# Patient Record
Sex: Female | Born: 1975
Health system: Southern US, Community
[De-identification: ages and names within clinical notes are randomized; demographics above are authoritative.]

## PROBLEM LIST (undated history)

## (undated) DIAGNOSIS — T8859XA Other complications of anesthesia, initial encounter: Secondary | ICD-10-CM

## (undated) DIAGNOSIS — R030 Elevated blood-pressure reading, without diagnosis of hypertension: Secondary | ICD-10-CM

## (undated) DIAGNOSIS — T4145XA Adverse effect of unspecified anesthetic, initial encounter: Secondary | ICD-10-CM

## (undated) DIAGNOSIS — Z8489 Family history of other specified conditions: Secondary | ICD-10-CM

## (undated) HISTORY — PX: HERNIA REPAIR: SHX51

## (undated) HISTORY — PX: WISDOM TOOTH EXTRACTION: SHX21

---

## 2003-09-24 ENCOUNTER — Ambulatory Visit (HOSPITAL_COMMUNITY): Admission: RE | Admit: 2003-09-24 | Discharge: 2003-09-24 | Payer: Self-pay | Admitting: *Deleted

## 2012-07-06 ENCOUNTER — Ambulatory Visit (INDEPENDENT_AMBULATORY_CARE_PROVIDER_SITE_OTHER): Payer: 59 | Admitting: Family Medicine

## 2012-07-06 VITALS — BP 108/75 | HR 61 | Temp 98.4°F | Resp 17 | Ht 64.5 in | Wt 148.0 lb

## 2012-07-06 DIAGNOSIS — F329 Major depressive disorder, single episode, unspecified: Secondary | ICD-10-CM

## 2012-07-06 MED ORDER — CITALOPRAM HYDROBROMIDE 20 MG PO TABS: 20.0000 mg | ORAL_TABLET | Freq: Every day | ORAL | Status: DC

## 2012-07-06 NOTE — Patient Instructions (Addendum)
Please start back on your celexa at 20 mg.  If we need to go up to 40 mg we can do so.  Please let me know how you are doing in the next 3 weeks or so- call or send me an email.  If you start to feel worse or are showing any signs of mania (not sleeping, racing thoughts, talking more than usual or other unusual behaviors) please let me know right away

## 2012-07-06 NOTE — Progress Notes (Signed)
[facility] [address], [place] [address] [phone]  Date:  07/06/2012   Name:  Erin Phelps   DOB:  09/29/1975   MRN:  [id number]  PCP:  No primary provider on file.    Chief Complaint: Depression and Medication Refill   History of Present Illness:  Erin Phelps is a 37 y.o. very pleasant female patient who presents with the following:  She is here today to talk about depression.  She was on celexa for a couple of years.  She had been out of her medicaiton and could not afford the medication for awhile.   She felt that she did well with celexa.  It helped with fatigue and helped her to avoid taking so much caffeine.   She was also on lamictal in the past- she was not diagnosed with bipolar disorder, it was used for depression. She never had any symptoms of mania. She was also going through a very hard time when she was on the lamictal, and her life is more stable and better at this time.    She has some anhedonia.  She feels very tired at the end of the day, and does not get a lot of joy out of her personal life.  "that's not the person who I am, I have friends and want to enjoy them."  She has had some trouble sleeping for the last couple of weeks- she sometimes wakes up too early.   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 She has noted recurrent symptoms of depression for 6 months or so.   She got her period yesterday.  Erin Phelps reports she had a complete blood panel done last year  There is no problem list on file for this patient.   Past Medical History  Diagnosis Date  . Depression     History reviewed. No pertinent past surgical history.  History  Substance Use Topics  . Smoking status: Current Every Day Smoker -- 0.5 packs/day for 10 years    Types: Cigarettes  . Smokeless tobacco: Not on file  . Alcohol Use: No    History reviewed. No pertinent family history.  No Known  Allergies  Medication list has been reviewed and updated.  No current outpatient prescriptions on file prior to visit.    Review of Systems:  As per HPI- otherwise negative. No SI, married, employed full- time  Physical Examination: Filed Vitals:   07/06/12 1152  BP: 108/75  Pulse: 61  Temp: 98.4 F (36.9 C)  Resp: 17   Filed Vitals:   07/06/12 1152  Height: 5' 4.5" (1.638 m)  Weight: 148 lb (67.132 kg)   Body mass index is 25.01 kg/(m^2). Ideal Body Weight: Weight in (lb) to have BMI = 25: 147.6   GEN: WDWN, NAD, Non-toxic, A & O x 3 HEENT: Atraumatic, Normocephalic. Neck supple. No masses, No LAD. Ears and Nose: No external deformity. CV: RRR, No M/G/R. No JVD. No thrill. No extra heart sounds. PULM: CTA B, no wheezes, crackles, rhonchi. No retractions. No resp. distress. No accessory muscle use. EXTR: No c/c/e NEURO Normal gait.  PSYCH: Normally interactive. Conversant. Not depressed or anxious appearing.  Calm demeanor.    Assessment and Plan: 1. Depression  citalopram (CELEXA) 20 MG tablet   Will restart celexa for depression.  Went over symptoms of mania- if she notes any of these symptoms she is  to let me know. However, she has not had mania in the past and does not have BPD.  She will call or email with an update in a few weeks and we can then discuss our next visit.    Abbe Amsterdam, MD

## 2012-09-03 ENCOUNTER — Encounter (HOSPITAL_COMMUNITY): Payer: Self-pay | Admitting: *Deleted

## 2012-09-03 ENCOUNTER — Emergency Department (HOSPITAL_COMMUNITY)
Admission: EM | Admit: 2012-09-03 | Discharge: 2012-09-04 | Disposition: A | Discharge status: Home / Self Care | Payer: 59 | Attending: Emergency Medicine | Admitting: Emergency Medicine

## 2012-09-03 DIAGNOSIS — Z3202 Encounter for pregnancy test, result negative: Secondary | ICD-10-CM | POA: Insufficient documentation

## 2012-09-03 DIAGNOSIS — F3289 Other specified depressive episodes: Secondary | ICD-10-CM | POA: Insufficient documentation

## 2012-09-03 DIAGNOSIS — Z79899 Other long term (current) drug therapy: Secondary | ICD-10-CM | POA: Insufficient documentation

## 2012-09-03 DIAGNOSIS — N10 Acute tubulo-interstitial nephritis: Secondary | ICD-10-CM | POA: Insufficient documentation

## 2012-09-03 DIAGNOSIS — F17200 Nicotine dependence, unspecified, uncomplicated: Secondary | ICD-10-CM | POA: Insufficient documentation

## 2012-09-03 DIAGNOSIS — M549 Dorsalgia, unspecified: Secondary | ICD-10-CM | POA: Insufficient documentation

## 2012-09-03 DIAGNOSIS — F329 Major depressive disorder, single episode, unspecified: Secondary | ICD-10-CM | POA: Insufficient documentation

## 2012-09-03 LAB — URINALYSIS, MICROSCOPIC ONLY
Bilirubin Urine: NEGATIVE
Glucose, UA: NEGATIVE mg/dL
Hgb urine dipstick: NEGATIVE
Ketones, ur: NEGATIVE mg/dL
Protein, ur: NEGATIVE mg/dL
pH: 5.5 (ref 5.0–8.0)

## 2012-09-03 LAB — CBC WITH DIFFERENTIAL/PLATELET
Eosinophils Absolute: 0.1 10*3/uL (ref 0.0–0.7)
Eosinophils Relative: 1 % (ref 0–5)
HCT: 29.8 % — ABNORMAL LOW (ref 36.0–46.0)
Lymphs Abs: 2.1 10*3/uL (ref 0.7–4.0)
MCH: 31.4 pg (ref 26.0–34.0)
MCHC: 37.9 g/dL — ABNORMAL HIGH (ref 30.0–36.0)
Monocytes Absolute: 0.3 10*3/uL (ref 0.1–1.0)
Platelets: 121 10*3/uL — ABNORMAL LOW (ref 150–400)

## 2012-09-03 LAB — COMPREHENSIVE METABOLIC PANEL
Alkaline Phosphatase: 58 U/L (ref 39–117)
Chloride: 104 mEq/L (ref 96–112)
Creatinine, Ser: 0.8 mg/dL (ref 0.50–1.10)
GFR calc non Af Amer: >90 mL/min (ref >90)
Glucose, Bld: 97 mg/dL (ref 70–99)
Potassium: 3.5 mEq/L (ref 3.5–5.1)
Sodium: 138 mEq/L (ref 135–145)
Total Bilirubin: 0.8 mg/dL (ref 0.3–1.2)
Total Protein: 7 g/dL (ref 6.0–8.3)

## 2012-09-03 NOTE — ED Notes (Signed)
Pt c/o abdominal pain and back pain x 2 days.  Went to [facility] today and were going to schedule ultrasound to look for gallstones, but pain has increased.  C/o nausea, no vomiting/diarrhea.

## 2012-09-04 ENCOUNTER — Emergency Department (HOSPITAL_COMMUNITY): Payer: 59

## 2012-09-04 MED ORDER — CEPHALEXIN 500 MG PO CAPS: 500.0000 mg | ORAL_CAPSULE | Freq: Four times a day (QID) | ORAL | Status: DC

## 2012-09-04 MED ORDER — KETOROLAC TROMETHAMINE 30 MG/ML IJ SOLN
INTRAMUSCULAR | Status: AC
  Filled 2012-09-04: qty 1

## 2012-09-04 MED ORDER — ONDANSETRON HCL 8 MG PO TABS: 8.0000 mg | ORAL_TABLET | Freq: Three times a day (TID) | ORAL | Status: DC | PRN

## 2012-09-04 MED ORDER — KETOROLAC TROMETHAMINE 30 MG/ML IJ SOLN
30.0000 mg | Freq: Once | INTRAMUSCULAR | Status: AC
  Administered 2012-09-04: 30 mg via INTRAVENOUS

## 2012-09-04 MED ORDER — ONDANSETRON HCL 4 MG/2ML IJ SOLN
INTRAMUSCULAR | Status: AC
  Filled 2012-09-04: qty 2

## 2012-09-04 MED ORDER — MORPHINE SULFATE 4 MG/ML IJ SOLN
INTRAMUSCULAR | Status: AC
  Filled 2012-09-04: qty 1

## 2012-09-04 MED ORDER — DEXTROSE 5 % IV SOLN
1.0000 g | INTRAVENOUS | Status: DC
  Administered 2012-09-04: 1 g via INTRAVENOUS
  Filled 2012-09-04: qty 10

## 2012-09-04 MED ORDER — HYDROCODONE-ACETAMINOPHEN 5-500 MG PO TABS: 1.0000 | ORAL_TABLET | Freq: Four times a day (QID) | ORAL | Status: DC | PRN

## 2012-09-04 MED ORDER — MORPHINE SULFATE 4 MG/ML IJ SOLN
4.0000 mg | Freq: Once | INTRAMUSCULAR | Status: AC
  Administered 2012-09-04: 4 mg via INTRAVENOUS
  Filled 2012-09-04: qty 1

## 2012-09-04 MED ORDER — ONDANSETRON HCL 4 MG/2ML IJ SOLN
4.0000 mg | Freq: Once | INTRAMUSCULAR | Status: AC
  Administered 2012-09-04: 4 mg via INTRAVENOUS

## 2012-09-04 MED ORDER — MORPHINE SULFATE 4 MG/ML IJ SOLN
4.0000 mg | Freq: Once | INTRAMUSCULAR | Status: AC
  Administered 2012-09-04: 4 mg via INTRAVENOUS

## 2012-09-04 NOTE — ED Provider Notes (Signed)
History     CSN: [id number]  Arrival date & time 09/03/12  2157   First MD Initiated Contact with Patient 09/03/12 2336      Chief Complaint  Patient presents with  . Abdominal Pain  . Back Pain    (Consider location/radiation/quality/duration/timing/severity/associated sxs/prior treatment) Patient is a 37 y.o. female presenting with abdominal pain and back pain. The history is provided by the patient.  Abdominal Pain Associated symptoms: no chest pain, no chills, no cough and no fever   Back Pain Associated symptoms: abdominal pain   Associated symptoms: no chest pain, no fever and no headaches   pt c/o right upper abd and right mid back pain, constant for past day, mod - severe. Non radiating. Worse w palpation. No dysuria, urgency or frequency. Nausea. No vomiting. No fever or chills. Pt states went to urgent care and wanted to send for u/s r/o gallstones. States has strong fam hx gallstones. No hx pyelo. No vaginal discharge or bleeding. Having regular periods.     Past Medical History  Diagnosis Date  . Depression     History reviewed. No pertinent past surgical history.  History reviewed. No pertinent family history.  History  Substance Use Topics  . Smoking status: Current Every Day Smoker -- 0.50 packs/day for 10 years    Types: Cigarettes  . Smokeless tobacco: Not on file  . Alcohol Use: No    OB History   Grav Para Term Preterm Abortions TAB SAB Ect Mult Living                  Review of Systems  Constitutional: Negative for fever and chills.  HENT: Negative for neck pain.   Eyes: Negative for redness.  Respiratory: Negative for cough.   Cardiovascular: Negative for chest pain.  Gastrointestinal: Positive for abdominal pain.  Genitourinary: Positive for flank pain.  Musculoskeletal: Positive for back pain.  Skin: Negative for rash.  Neurological: Negative for headaches.  Hematological: Does not bruise/bleed easily.  Psychiatric/Behavioral:  Negative for confusion.    Allergies  Review of patient's allergies indicates no known allergies.  Home Medications   Current Outpatient Rx  Name  Route  Sig  Dispense  Refill  . acetaminophen (TYLENOL) 500 MG tablet   Oral   Take 500 mg by mouth every 6 (six) hours as needed for pain.         . citalopram (CELEXA) 20 MG tablet   Oral   Take 1 tablet (20 mg total) by mouth daily.   30 tablet   6   . ibuprofen (ADVIL,MOTRIN) 200 MG tablet   Oral   Take 200 mg by mouth every 6 (six) hours as needed for pain, fever or headache.         . methocarbamol (ROBAXIN) 500 MG tablet   Oral   Take 500 mg by mouth 3 (three) times daily as needed (for pain).           BP 138/85  Pulse 82  Temp(Src) 98.4 F (36.9 C) (Oral)  Resp 18  Ht 5\' 4"  (1.626 m)  Wt 145 lb 9.6 oz (66.044 kg)  BMI 24.98 kg/m2  SpO2 99%  LMP 08/24/2012  Physical Exam  Nursing note and vitals reviewed. Constitutional: She appears well-developed and well-nourished. No distress.  HENT:  Nose: Nose normal.  Eyes: Conjunctivae are normal. No scleral icterus.  Neck: Neck supple. No tracheal deviation present.  Cardiovascular: Normal rate.   Pulmonary/Chest: Effort normal. No respiratory  distress.  Abdominal: Soft. Normal appearance. She exhibits no distension and no mass. There is tenderness. There is no rebound and no guarding.  ruq tenderness  Genitourinary:  Right cva tenderness  Musculoskeletal: She exhibits no edema.  Neurological: She is alert.  Skin: Skin is warm and dry. No rash noted.  Psychiatric: She has a normal mood and affect.    ED Course  Procedures (including critical care time)  Results for orders placed during the hospital encounter of 09/03/12  CBC WITH DIFFERENTIAL      Result Value Range   WBC 6.5  4.0 - 10.5 K/uL   RBC 3.60 (*) 3.87 - 5.11 MIL/uL   Hemoglobin 11.3 (*) 12.0 - 15.0 g/dL   HCT 09.8 (*) 11.9 - 14.7 %   MCV 82.8  78.0 - 100.0 fL   MCH 31.4  26.0 - 34.0 pg    MCHC 37.9 (*) 30.0 - 36.0 g/dL   RDW 82.9  56.2 - 13.0 %   Platelets 121 (*) 150 - 400 K/uL   Neutrophils Relative 62  43 - 77 %   Neutro Abs 4.0  1.7 - 7.7 K/uL   Lymphocytes Relative 32  12 - 46 %   Lymphs Abs 2.1  0.7 - 4.0 K/uL   Monocytes Relative 5  3 - 12 %   Monocytes Absolute 0.3  0.1 - 1.0 K/uL   Eosinophils Relative 1  0 - 5 %   Eosinophils Absolute 0.1  0.0 - 0.7 K/uL   Basophils Relative 0  0 - 1 %   Basophils Absolute 0.0  0.0 - 0.1 K/uL  COMPREHENSIVE METABOLIC PANEL      Result Value Range   Sodium 138  135 - 145 mEq/L   Potassium 3.5  3.5 - 5.1 mEq/L   Chloride 104  96 - 112 mEq/L   CO2 25  19 - 32 mEq/L   Glucose, Bld 97  70 - 99 mg/dL   BUN 14  6 - 23 mg/dL   Creatinine, Ser 8.65  0.50 - 1.10 mg/dL   Calcium 9.4  8.4 - 78.4 mg/dL   Total Protein 7.0  6.0 - 8.3 g/dL   Albumin 4.4  3.5 - 5.2 g/dL   AST 17  0 - 37 U/L   ALT 11  0 - 35 U/L   Alkaline Phosphatase 58  39 - 117 U/L   Total Bilirubin 0.8  0.3 - 1.2 mg/dL   GFR calc non Af Amer >90  >90 mL/min   GFR calc Af Amer >90  >90 mL/min  LIPASE, BLOOD      Result Value Range   Lipase 30  11 - 59 U/L  URINALYSIS, MICROSCOPIC ONLY      Result Value Range   Color, Urine YELLOW  YELLOW   APPearance CLOUDY (*) CLEAR   Specific Gravity, Urine 1.020  1.005 - 1.030   pH 5.5  5.0 - 8.0   Glucose, UA NEGATIVE  NEGATIVE mg/dL   Hgb urine dipstick NEGATIVE  NEGATIVE   Bilirubin Urine NEGATIVE  NEGATIVE   Ketones, ur NEGATIVE  NEGATIVE mg/dL   Protein, ur NEGATIVE  NEGATIVE mg/dL   Urobilinogen, UA 0.2  0.0 - 1.0 mg/dL   Nitrite POSITIVE (*) NEGATIVE   Leukocytes, UA MODERATE (*) NEGATIVE   WBC, UA 7-10  <3 WBC/hpf   RBC / HPF 0-2  <3 RBC/hpf   Bacteria, UA MANY (*) RARE   Squamous Epithelial / LPF RARE  RARE  POCT PREGNANCY, URINE      Result Value Range   Preg Test, Ur NEGATIVE  NEGATIVE      MDM  Iv ns. Labs. Morphine iv. zofran iv. Rocephin iv.  Pt notes strong fam hx gallstones, states her  doctor wants u/s abd.   Reviewed nursing notes and prior charts for additional history.           Suzi Roots, MD 09/10/12 2152

## 2012-09-04 NOTE — ED Notes (Signed)
The patient is AOx4 and comfortable with her discharge instructions.  Her husband is driving her home.

## 2012-09-06 LAB — URINE CULTURE: Colony Count: >=100000

## 2012-09-07 ENCOUNTER — Telehealth (HOSPITAL_COMMUNITY): Payer: Self-pay | Admitting: Emergency Medicine

## 2012-09-07 NOTE — ED Notes (Signed)
+  Urine. Patient treated with Keflex. Sensitive to same. Per protocol MD. °

## 2012-09-07 NOTE — ED Notes (Signed)
Patient has +Urine culture. Checking to see if appropriately treated. °

## 2013-05-01 ENCOUNTER — Other Ambulatory Visit: Payer: Self-pay | Admitting: Family Medicine

## 2013-07-21 ENCOUNTER — Encounter (HOSPITAL_COMMUNITY): Payer: Self-pay | Admitting: Emergency Medicine

## 2013-07-21 ENCOUNTER — Emergency Department (HOSPITAL_COMMUNITY)
Admission: EM | Admit: 2013-07-21 | Discharge: 2013-07-21 | Disposition: A | Discharge status: Home / Self Care | Payer: 59 | Source: Home / Self Care | Attending: Emergency Medicine | Admitting: Emergency Medicine

## 2013-07-21 DIAGNOSIS — J329 Chronic sinusitis, unspecified: Secondary | ICD-10-CM

## 2013-07-21 MED ORDER — HYDROCODONE-ACETAMINOPHEN 5-325 MG PO TABS: 1.0000 | ORAL_TABLET | Freq: Four times a day (QID) | ORAL | Status: DC | PRN

## 2013-07-21 MED ORDER — AMOXICILLIN-POT CLAVULANATE 875-125 MG PO TABS
1.0000 | ORAL_TABLET | Freq: Two times a day (BID) | ORAL | Status: DC
Start: 2013-07-21 — End: 2014-02-11

## 2013-07-21 MED ORDER — PSEUDOEPHEDRINE HCL 30 MG PO TABS: 30.0000 mg | ORAL_TABLET | Freq: Four times a day (QID) | ORAL | Status: DC | PRN

## 2013-07-21 NOTE — ED Provider Notes (Signed)
CSN: [id number]     Arrival date & time 07/21/13  1807 History   First MD Initiated Contact with Patient 07/21/13 1922     Chief Complaint  Patient presents with  . Headache     (Consider location/radiation/quality/duration/timing/severity/associated sxs/prior Treatment) The history is provided by the patient.  Madhuri Vacca is a 38 y.o. female who presents to the UC with facial pain that started 2 days ago. She states that she started with pressure under her eyes and it has gotten worse all around her eyes and into her teeth. She has a sore throat but no cough. She has been taking tylenol, ibuprofen, and OTC congestion medication without relief. She denies fever or chills. She had a little feeling of nausea at work today but no vomiting or diarrhea.   Past Medical History  Diagnosis Date  . Depression    Past Surgical History  Procedure Laterality Date  . Hernia repair  1980    diaphramatic   History reviewed. No pertinent family history. History  Substance Use Topics  . Smoking status: Current Every Day Smoker -- 0.50 packs/day for 10 years    Types: Cigarettes  . Smokeless tobacco: Not on file  . Alcohol Use: No   OB History   Grav Para Term Preterm Abortions TAB SAB Ect Mult Living                 Review of Systems  Constitutional: Negative for fever and chills.  HENT: Positive for congestion, sinus pressure and sore throat. Negative for ear pain and trouble swallowing.   Eyes: Negative for visual disturbance.  Respiratory: Negative for cough, shortness of breath and wheezing.   Cardiovascular: Negative for chest pain.  Gastrointestinal: Positive for nausea. Negative for vomiting, abdominal pain and diarrhea.  Musculoskeletal: Negative for myalgias.  Skin: Negative for rash.  Neurological: Positive for headaches (in sinus area).  Psychiatric/Behavioral: Negative for confusion.      Allergies  Review of patient's allergies indicates no known allergies.  Home  Medications   Current Outpatient Rx  Name  Route  Sig  Dispense  Refill  . acetaminophen (TYLENOL) 500 MG tablet   Oral   Take 500 mg by mouth every 6 (six) hours as needed for pain.         Marland Kitchen ibuprofen (ADVIL,MOTRIN) 200 MG tablet   Oral   Take 200 mg by mouth every 6 (six) hours as needed for pain, fever or headache.         . cephALEXin (KEFLEX) 500 MG capsule   Oral   Take 1 capsule (500 mg total) by mouth 4 (four) times daily.   28 capsule   0   . citalopram (CELEXA) 20 MG tablet   Oral   Take 1 tablet (20 mg total) by mouth daily. PATIENT NEEDS OFFICE VISIT FOR ADDITIONAL REFILLS   30 tablet   0   . HYDROcodone-acetaminophen (VICODIN) 5-500 MG per tablet   Oral   Take 1-2 tablets by mouth every 6 (six) hours as needed for pain.   20 tablet   0   . methocarbamol (ROBAXIN) 500 MG tablet   Oral   Take 500 mg by mouth 3 (three) times daily as needed (for pain).         . ondansetron (ZOFRAN) 8 MG tablet   Oral   Take 1 tablet (8 mg total) by mouth every 8 (eight) hours as needed for nausea.   12 tablet   0  BP 115/77  Pulse 72  Temp(Src) 98.5 F (36.9 C) (Oral)  Resp 16  SpO2 100%  LMP 07/06/2013 Physical Exam  Nursing note and vitals reviewed. Constitutional: She is oriented to person, place, and time. She appears well-developed and well-nourished.  HENT:  Right Ear: Tympanic membrane normal.  Left Ear: Tympanic membrane normal.  Nose: Rhinorrhea present. Right sinus exhibits maxillary sinus tenderness and frontal sinus tenderness. Left sinus exhibits maxillary sinus tenderness and frontal sinus tenderness.  Mouth/Throat: Uvula is midline, oropharynx is clear and moist and mucous membranes are normal.  Eyes: EOM are normal.  Neck: Neck supple.  Cardiovascular: Normal rate and regular rhythm.   Pulmonary/Chest: Effort normal. She has no wheezes. She has no rales.  Abdominal: Soft. There is no tenderness.  Musculoskeletal: Normal range of motion.   Lymphadenopathy:    She has cervical adenopathy.  Neurological: She is alert and oriented to person, place, and time. No cranial nerve deficit.  Skin: Skin is warm and dry.  Psychiatric: She has a normal mood and affect. Her behavior is normal.    ED Course  Procedures    MDM  38 y.o. female with sinus headache and pressure. Will treat for sinusitis. No meningeal signs, no fever. I discussed in detail with the patient if her symptoms worsen such as persistent vomiting, neck pain, high fever or other problems to go to the ED. She voices understanding.     [facility] Orlene OchM Neese, [place]NP 07/21/13 [phone]

## 2013-07-21 NOTE — ED Notes (Signed)
C/o headache, sinus pressure, facial pain, and photophobia onset 2 days. Pain prog. worse.  Taking Tylenol and Ibuprofen for pain.  No chills or fever.  Feels like someone punched her in the face.  Throat is getting a little sore and occ. Cough.

## 2013-07-22 NOTE — ED Provider Notes (Signed)
Medical screening examination/treatment/procedure(s) were performed by non-physician practitioner and as supervising physician I was immediately available for consultation/collaboration.  Jacquis Paxton, M.D.  Darothy Courtright C Josyah Achor, MD 07/22/13 0808 

## 2013-08-01 ENCOUNTER — Emergency Department (HOSPITAL_COMMUNITY)
Admission: EM | Admit: 2013-08-01 | Discharge: 2013-08-01 | Disposition: A | Discharge status: Home / Self Care | Payer: 59 | Source: Home / Self Care | Attending: Emergency Medicine | Admitting: Emergency Medicine

## 2013-08-01 ENCOUNTER — Encounter (HOSPITAL_COMMUNITY): Payer: Self-pay | Admitting: Emergency Medicine

## 2013-08-01 DIAGNOSIS — J019 Acute sinusitis, unspecified: Secondary | ICD-10-CM

## 2013-08-01 MED ORDER — FLUTICASONE PROPIONATE 50 MCG/ACT NA SUSP: 2.0000 | Freq: Every day | NASAL | Status: DC

## 2013-08-01 MED ORDER — LEVOFLOXACIN 750 MG PO TABS: 750.0000 mg | ORAL_TABLET | Freq: Every day | ORAL | Status: DC

## 2013-08-01 MED ORDER — OXYCODONE-ACETAMINOPHEN 5-325 MG PO TABS: ORAL_TABLET | ORAL | Status: DC

## 2013-08-01 NOTE — ED Notes (Signed)
Pt reports being seen two weeks ago and treated for a sinus infection.   States that after finishing meds symptoms return.  Nasal stuffiness.  Sinus pressure and pain.  Low grade temp.

## 2013-08-01 NOTE — ED Provider Notes (Signed)
Chief Complaint   Chief Complaint  Patient presents with  . Follow-up    History of Present Illness   Erin Phelps is a 38 year old female who was here 11 days ago for symptoms of sinusitis. She was treated with Augmentin. Symptoms improved temporarily while she was on antibiotic but when she finished it they came back again. Her symptom complexes lasted for about 3 weeks at all. She describes nasal congestion without any drainage, sore throat, sinus pain, headache, and ear congestion. She denies fever, chills, coughing, wheezing, or GI symptoms. She has no history of allergies.  Review of Systems   Other than as noted above, the patient denies any of the following symptoms: Systemic:  No fevers, chills, or sweats. Eye:  No redness, pain, discharge, itching, blurred vision, or diplopia. ENT:  No headache, nasal congestion, sneezing, itching, epistaxis, ear pain, congestion, decreased hearing, ringing in ears, vertigo, or tinnitus.  No oral lesions, sore throat, pain on swallowing, or hoarseness. Neck:  No mass, tenderness or adenopathy. Lungs:  No coughing, wheezing, or shortness of breath. Skin:  No rash or itching.  PMFSH   Past medical history, family history, social history, meds, and allergies were reviewed.   Physical Examination     Vital signs:  BP 150/67  Pulse 90  Temp(Src) 99.5 F (37.5 C) (Oral)  Resp 16  SpO2 100%  LMP 08/01/2013 General:  Alert and oriented.  In no distress.  Skin warm and dry. Eye:  PERRL, full EOMs, lids and conjunctiva normal.   ENT:  TMs and canals clear.  Nasal mucosa not congested and without drainage.  Mucous membranes moist, no oral lesions, normal dentition, pharynx clear.  There is marked at tenderness to palpation around the eyes, and the frontal area, and over both maxillary areas. Neck:  Supple, full ROM.  No adenopathy, tenderness or mass.  Thyroid normal. Lungs:  Breath sounds clear and equal bilaterally.  No wheezes, rales or  rhonchi. Heart:  Rhythm regular, without extrasystoles.  No gallops or murmers. Skin:  Clear, warm and dry.   Assessment   The encounter diagnosis was Acute sinusitis.  I think she had an episode of sinusitis that partially improved, but then came back again. She was given a prescription for Levaquin 750 mg once a day for 10 days and Flonase nasal spray. She can get the prescription for Levaquin refill if no better at the end of 10 days. If after 20 days of treatment she is no better, she'll need to see ENT.  Plan    1.  Meds:  The following meds were prescribed:   Discharge Medication List as of 08/01/2013  4:59 PM    START taking these medications   Details  fluticasone (FLONASE) 50 MCG/ACT nasal spray Place 2 sprays into both nostrils daily., Starting 08/01/2013, Until Discontinued, Normal    levofloxacin (LEVAQUIN) 750 MG tablet Take 1 tablet (750 mg total) by mouth daily., Starting 08/01/2013, Until Discontinued, Normal    oxyCODONE-acetaminophen (PERCOCET) 5-325 MG per tablet 1 to 2 tablets every 6 hours as needed for pain., Print        2.  Patient Education/Counseling:  The patient was given appropriate handouts, self care instructions, and instructed in symptomatic relief.  Suggested inhaling steam, hot compresses to the face, elevate the head of the bed at nighttime.  3.  Follow up:  The patient was told to follow up here if no better in 3 to 4 days, or sooner if becoming worse  in any way, and given some red flag symptoms such as  Difficulty breathing or fever which would prompt immediate return.  Followup with Dr. Suzanna ObeyJohn Byers if no improvement in 3 weeks.     Reuben Likesavid C Javeah Loeza, MD 08/01/13 1900

## 2013-08-01 NOTE — Discharge Instructions (Signed)

## 2013-09-24 ENCOUNTER — Emergency Department (HOSPITAL_COMMUNITY)
Admission: EM | Admit: 2013-09-24 | Discharge: 2013-09-24 | Disposition: A | Discharge status: Home / Self Care | Payer: 59 | Source: Home / Self Care

## 2013-09-24 ENCOUNTER — Encounter (HOSPITAL_COMMUNITY): Payer: Self-pay | Admitting: Emergency Medicine

## 2013-09-24 DIAGNOSIS — J309 Allergic rhinitis, unspecified: Secondary | ICD-10-CM

## 2013-09-24 LAB — POCT RAPID STREP A: STREPTOCOCCUS, GROUP A SCREEN (DIRECT): NEGATIVE

## 2013-09-24 MED ORDER — METHYLPREDNISOLONE 4 MG PO KIT: PACK | ORAL | Status: DC

## 2013-09-24 NOTE — ED Provider Notes (Signed)
Medical screening examination/treatment/procedure(s) were performed by a resident physician or non-physician practitioner and as the supervising physician I was immediately available for consultation/collaboration.  Lanetta Figuero, MD    Enzo Treu S Jimmie Dattilio, MD 09/24/13 2119 

## 2013-09-24 NOTE — Discharge Instructions (Signed)
Allergic Rhinitis Zyrtec Sudafed PE or pseudoephedrine for congestion Lots of nasal saline frequently flonase nasal spray Plain mucinex or Robitussin   Allergic rhinitis is when the mucous membranes in the nose respond to allergens. Allergens are particles in the air that cause your body to have an allergic reaction. This causes you to release allergic antibodies. Through a chain of events, these eventually cause you to release histamine into the blood stream. Although meant to protect the body, it is this release of histamine that causes your discomfort, such as frequent sneezing, congestion, and an itchy, runny nose.  CAUSES  Seasonal allergic rhinitis (hay fever) is caused by pollen allergens that may come from grasses, trees, and weeds. Year-round allergic rhinitis (perennial allergic rhinitis) is caused by allergens such as house dust mites, pet dander, and mold spores.  SYMPTOMS   Nasal stuffiness (congestion).  Itchy, runny nose with sneezing and tearing of the eyes. DIAGNOSIS  Your health care provider can help you determine the allergen or allergens that trigger your symptoms. If you and your health care provider are unable to determine the allergen, skin or blood testing may be used. TREATMENT  Allergic Rhinitis does not have a cure, but it can be controlled by:  Medicines and allergy shots (immunotherapy).  Avoiding the allergen. Hay fever may often be treated with antihistamines in pill or nasal spray forms. Antihistamines block the effects of histamine. There are over-the-counter medicines that may help with nasal congestion and swelling around the eyes. Check with your health care provider before taking or giving this medicine.  If avoiding the allergen or the medicine prescribed do not work, there are many new medicines your health care provider can prescribe. Stronger medicine may be used if initial measures are ineffective. Desensitizing injections can be used if medicine  and avoidance does not work. Desensitization is when a patient is given ongoing shots until the body becomes less sensitive to the allergen. Make sure you follow up with your health care provider if problems continue. HOME CARE INSTRUCTIONS It is not possible to completely avoid allergens, but you can reduce your symptoms by taking steps to limit your exposure to them. It helps to know exactly what you are allergic to so that you can avoid your specific triggers. SEEK MEDICAL CARE IF:   You have a fever.  You develop a cough that does not stop easily (persistent).  You have shortness of breath.  You start wheezing.  Symptoms interfere with normal daily activities. Document Released: 02/13/2001 Document Revised: 03/11/2013 Document Reviewed: 01/26/2013 [facility] Patient Information 2014 [employer] [place], [place].

## 2013-09-24 NOTE — ED Provider Notes (Signed)
CSN: [id number]     Arrival date & time 09/24/13  0801 History   First MD Initiated Contact with Patient 09/24/13 0831     Chief Complaint  Patient presents with  . URI   (Consider location/radiation/quality/duration/timing/severity/associated sxs/prior Treatment) HPI Comments: 4-65 d hx of f/m low grade fever, ST, myalgias, headache, sinus pressure, chills. Hx of chronic sinus issues   Past Medical History  Diagnosis Date  . Depression    Past Surgical History  Procedure Laterality Date  . Hernia repair  1980    diaphramatic   History reviewed. No pertinent family history. History  Substance Use Topics  . Smoking status: Current Every Day Smoker -- 0.50 packs/day for 10 years    Types: Cigarettes  . Smokeless tobacco: Not on file  . Alcohol Use: No   OB History   Grav Para Term Preterm Abortions TAB SAB Ect Mult Living                 Review of Systems  Constitutional: Positive for fever, activity change and fatigue. Negative for chills and appetite change.  HENT: Positive for congestion, postnasal drip, rhinorrhea, sinus pressure and sore throat. Negative for facial swelling.   Eyes: Negative.   Respiratory: Positive for cough.   Cardiovascular: Negative.   Gastrointestinal: Negative.   Genitourinary: Negative.   Musculoskeletal: Negative for neck pain and neck stiffness.  Skin: Negative for pallor and rash.  Neurological: Negative.     Allergies  Review of patient's allergies indicates no known allergies.  Home Medications   Prior to Admission medications   Medication Sig Start Date End Date Taking? Authorizing Provider  acetaminophen (TYLENOL) 500 MG tablet Take 500 mg by mouth every 6 (six) hours as needed for pain.    Historical Provider, MD  amoxicillin-clavulanate (AUGMENTIN) 875-125 MG per tablet Take 1 tablet by mouth 2 (two) times daily. 07/21/13   Hope Orlene OchM Neese, NP  cephALEXin (KEFLEX) 500 MG capsule Take 1 capsule (500 mg total) by mouth 4 (four)  times daily. 09/04/12   Suzi RootsKevin E Steinl, MD  citalopram (CELEXA) 20 MG tablet Take 1 tablet (20 mg total) by mouth daily. PATIENT NEEDS OFFICE VISIT FOR ADDITIONAL REFILLS 05/01/13   Pearline CablesJessica C Copland, MD  fluticasone (FLONASE) 50 MCG/ACT nasal spray Place 2 sprays into both nostrils daily. 08/01/13   Reuben Likesavid C Keller, MD  HYDROcodone-acetaminophen (NORCO) 5-325 MG per tablet Take 1 tablet by mouth every 6 (six) hours as needed for moderate pain. 07/21/13   Hope Orlene OchM Neese, NP  HYDROcodone-acetaminophen (VICODIN) 5-500 MG per tablet Take 1-2 tablets by mouth every 6 (six) hours as needed for pain. 09/04/12   Suzi RootsKevin E Steinl, MD  ibuprofen (ADVIL,MOTRIN) 200 MG tablet Take 200 mg by mouth every 6 (six) hours as needed for pain, fever or headache.    Historical Provider, MD  levofloxacin (LEVAQUIN) 750 MG tablet Take 1 tablet (750 mg total) by mouth daily. 08/01/13   Reuben Likesavid C Keller, MD  methocarbamol (ROBAXIN) 500 MG tablet Take 500 mg by mouth 3 (three) times daily as needed (for pain).    Historical Provider, MD  ondansetron (ZOFRAN) 8 MG tablet Take 1 tablet (8 mg total) by mouth every 8 (eight) hours as needed for nausea. 09/04/12   Suzi RootsKevin E Steinl, MD  oxyCODONE-acetaminophen (PERCOCET) 5-325 MG per tablet 1 to 2 tablets every 6 hours as needed for pain. 08/01/13   Reuben Likesavid C Keller, MD  pseudoephedrine (SUDAFED) 30 MG tablet Take 1 tablet (30  mg total) by mouth every 6 (six) hours as needed for congestion. 07/21/13   Hope Orlene OchM Neese, NP   BP 129/92  Pulse 95  Temp(Src) 99.2 F (37.3 C) (Oral)  Resp 16  SpO2 100% Physical Exam  Nursing note and vitals reviewed. Constitutional: She is oriented to person, place, and time. She appears well-developed and well-nourished. No distress.  HENT:  Mouth/Throat: No oropharyngeal exudate.  Bialt TM's nl OP with minor injection.  Eyes: Conjunctivae and EOM are normal.  Neck: Normal range of motion. Neck supple.  Cardiovascular: Normal rate, regular rhythm and normal heart  sounds.   Pulmonary/Chest: Effort normal and breath sounds normal. No respiratory distress.  Musculoskeletal: Normal range of motion. She exhibits no edema.  Lymphadenopathy:    She has no cervical adenopathy.  Neurological: She is alert and oriented to person, place, and time.  Skin: Skin is warm and dry. No rash noted.  Psychiatric: She has a normal mood and affect.    ED Course  Procedures (including critical care time) Labs Review Labs Reviewed - No data to display  Imaging Review No results found.   MDM   1. Allergic rhinosinusitis    OTC meds as written  Medrol Fluids, nasal sprays Tylenol     Hayden Rasmussenavid Walden Statz, NP 09/24/13 [phone]

## 2013-09-24 NOTE — ED Notes (Signed)
Pt   Reports        Symptoms  Of        Pressure   In   Both   Sinuses  sorethroat  Body    Aches     Had   Fever  Earlier        Pt  Reports   Symptoms  Of       Fever  Earlier  As  Well  Pt   Reports  Symptoms   releived  Somewhat  By  otc  meds     Pt  Reports  Has  A  History of  Sinus  Problems  In past

## 2013-09-26 LAB — CULTURE, GROUP A STREP

## 2014-02-11 ENCOUNTER — Other Ambulatory Visit: Payer: Self-pay | Admitting: Otolaryngology

## 2014-02-11 ENCOUNTER — Encounter (HOSPITAL_BASED_OUTPATIENT_CLINIC_OR_DEPARTMENT_OTHER): Payer: Self-pay | Admitting: *Deleted

## 2014-02-11 NOTE — H&P (Signed)
Erin Phelps, Erin Phelps 38 y.o., female [id number]     Chief Complaint:  Recurrent sinus infections, nasal obstruction, possible rhinogenic headaches  HPI: 6 months return visit for this 38 year old white female.  She saw Dr. Jearld Fenton last spring.  She had facial pressure and nasal congestion.  A CT scan showed narrow airways, large inferior turbinates.  A conchal cell of each middle turbinate, and a somewhat narrow but patent ostiomeatal complex on each side.  She does have a mid posterior chondroethmoid spur on the RIGHT side which may be causing deep headaches.  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  Since that time she has been on QNasl, and also either Allegra or Claritin daily.  She feels like she is under better control from what she had previously thought was a history of recurrent sinus infections.  She does not think she has much in the way of seasonal allergies.  She did have a routine upper respiratory infection in June including headaches which settled down.  With Dr. Jearld Fenton, she tried Afrin once or twice which did seem to open her nose, and also relieve the headache.   2 weeks ago, she seemed to have another sinus infection with bifrontal and bimaxillary discomfort, nasal congestion, and some brownish nasal drainage, RIGHT more than LEFT.  No vision changes.  No documented fever.  No ear problems.  She was treated with Levaquin, prednisone, Afrin for 2 days, and continued her QNasl.  At that time she call for an appointment, she did not think she was getting better.  She has now been off the Levaquin for roughly 8 days and does think she is generally mostly improved.  She is aware that she is never much of a nose breather.  She does not think she has active snoring according to her husband.  Nothing to suggest sleep apnea.  No prior history of nasal trauma or nasal surgery.  PMH: Past Medical History  Diagnosis Date  . Complication of anesthesia     hx. of being hard to wake up  post-op  . Family history of anesthesia complication     pt's mother has hx. of being hard to wake up post-op  . Dental crowns present     also dental caps  . Deviated nasal septum 02/2014  . Hypertrophy of nasal turbinates 02/2014    Surg Hx: Past Surgical History  Procedure Laterality Date  . Hernia repair  age 21 mos.    diaphramatic  . Wisdom tooth extraction      FHx:   Family History  Problem Relation Age of Onset  . Anesthesia problems Mother     hard to wake up post-op   SocHx:  reports that she has been smoking E-cigarettes.  She has a 7.5 pack-year smoking history. She has never used smokeless tobacco. She reports that she does not drink alcohol or use illicit drugs.  ALLERGIES: No Known Allergies   (Not in a hospital admission)  No results found for this or any previous visit (from the past 48 hour(s)). No results found.    Last menstrual period 01/25/2014.  PHYSICAL EXAM: She is trim and healthy.  Mental status is sharp.  She hears well in conversational speech.  Voice is clear and respirations unlabored through nose and mouth.  The head is atraumatic and neck supple.  Cranial nerves intact.  Ear canals are clear with normal drums.  The internal nose is overall narrow with bulky inferior and middle  turbinates.  There is a moderate spur posteriorly on the RIGHT side.  When I touched this with the end of the Q-tip, she feels like this does reproduce her pain.  No polyps or active drainage.  Oral cavity and pharynx clear.  Neck unremarkable.   Lungs: Clear to auscultation Heart: Regular rate and rhythm without murmur Abdomen: Soft, active Studies: Normal configuration Neurologic: Symmetric, grossly intact.  Studies Reviewed:  CT sinuses, MAR 2015    Assessment/Plan Chronic rhinitis (472.0) (J31.0). Allergic rhinitis due to other allergen (477.8) (J30.9). Deviated nasal septum (470) (J34.2). Headache (784.0) (R51). Hypertrophy of nasal turbinates (478.0)  (J34.3).  Your nose is overall somewhat narrow.  If you did have a sinus infection 2 weeks ago, I do think you have gotten over it.  I think between the narrow nose which does not breathe very well, and the RIGHT nasal septal spur which may be causing headaches, I recommend a nasal septoplasty, and reduction of the inferior and middle turbinates to open up your nasal breathing passages and your sinuses.  Anticipate return to work around 7-10 days.  I will see you  back here in the office one and again 10 days after surgery.  I am giving you a prescription for Vicodin for pain relief, and Keflex antibiotics for after surgery.  Cephalexin 500 MG Oral Capsule;TAKE 1 CAPSULE 4 TIMES DAILY; Qty40; R0; Rx. Hydrocodone-Acetaminophen 5-325 MG Oral Tablet;1-2 po q4-6h prn pain; Qty40; R0; Rx.  Jaime Grizzell 02/11/2014, 1:18 PM     

## 2014-02-15 ENCOUNTER — Encounter (HOSPITAL_BASED_OUTPATIENT_CLINIC_OR_DEPARTMENT_OTHER)
Admission: RE | Disposition: A | Discharge status: Home / Self Care | Payer: Self-pay | Source: Ambulatory Visit | Attending: Otolaryngology

## 2014-02-15 ENCOUNTER — Ambulatory Visit (HOSPITAL_BASED_OUTPATIENT_CLINIC_OR_DEPARTMENT_OTHER)
Admission: RE | Admit: 2014-02-15 | Discharge: 2014-02-15 | Disposition: A | Discharge status: Home / Self Care | Payer: 59 | Source: Ambulatory Visit | Attending: Otolaryngology | Admitting: Otolaryngology

## 2014-02-15 ENCOUNTER — Ambulatory Visit (HOSPITAL_BASED_OUTPATIENT_CLINIC_OR_DEPARTMENT_OTHER): Payer: 59 | Admitting: Anesthesiology

## 2014-02-15 ENCOUNTER — Encounter (HOSPITAL_BASED_OUTPATIENT_CLINIC_OR_DEPARTMENT_OTHER): Payer: 59 | Admitting: Anesthesiology

## 2014-02-15 ENCOUNTER — Encounter (HOSPITAL_BASED_OUTPATIENT_CLINIC_OR_DEPARTMENT_OTHER): Payer: Self-pay | Admitting: Anesthesiology

## 2014-02-15 DIAGNOSIS — R51 Headache: Secondary | ICD-10-CM | POA: Insufficient documentation

## 2014-02-15 DIAGNOSIS — J3489 Other specified disorders of nose and nasal sinuses: Secondary | ICD-10-CM | POA: Diagnosis not present

## 2014-02-15 DIAGNOSIS — J343 Hypertrophy of nasal turbinates: Secondary | ICD-10-CM | POA: Diagnosis not present

## 2014-02-15 DIAGNOSIS — J3089 Other allergic rhinitis: Secondary | ICD-10-CM | POA: Diagnosis not present

## 2014-02-15 DIAGNOSIS — F17200 Nicotine dependence, unspecified, uncomplicated: Secondary | ICD-10-CM | POA: Diagnosis not present

## 2014-02-15 DIAGNOSIS — J342 Deviated nasal septum: Secondary | ICD-10-CM | POA: Diagnosis present

## 2014-02-15 HISTORY — PX: NASAL SEPTOPLASTY W/ TURBINOPLASTY: SHX2070

## 2014-02-15 HISTORY — DX: Adverse effect of unspecified anesthetic, initial encounter: T41.45XA

## 2014-02-15 HISTORY — DX: Other complications of anesthesia, initial encounter: T88.59XA

## 2014-02-15 HISTORY — DX: Family history of other specified conditions: Z84.89

## 2014-02-15 LAB — POCT HEMOGLOBIN-HEMACUE: Hemoglobin: 12.4 g/dL (ref 12.0–15.0)

## 2014-02-15 SURGERY — SEPTOPLASTY, NOSE, WITH NASAL TURBINATE REDUCTION
Anesthesia: General

## 2014-02-15 MED ORDER — OXYCODONE HCL 5 MG/5ML PO SOLN: 5.0000 mg | Freq: Once | ORAL | Status: AC | PRN

## 2014-02-15 MED ORDER — FENTANYL CITRATE 0.05 MG/ML IJ SOLN: 50.0000 ug | INTRAMUSCULAR | Status: DC | PRN

## 2014-02-15 MED ORDER — FENTANYL CITRATE 0.05 MG/ML IJ SOLN
INTRAMUSCULAR | Status: DC | PRN
  Administered 2014-02-15 (×4): 25 ug via INTRAVENOUS
  Administered 2014-02-15 (×4): 50 ug via INTRAVENOUS

## 2014-02-15 MED ORDER — MIDAZOLAM HCL 2 MG/2ML IJ SOLN: 1.0000 mg | INTRAMUSCULAR | Status: DC | PRN

## 2014-02-15 MED ORDER — CEFAZOLIN SODIUM-DEXTROSE 2-3 GM-% IV SOLR
INTRAVENOUS | Status: AC
  Filled 2014-02-15: qty 50

## 2014-02-15 MED ORDER — MIDAZOLAM HCL 2 MG/ML PO SYRP: 12.0000 mg | ORAL_SOLUTION | Freq: Once | ORAL | Status: DC | PRN

## 2014-02-15 MED ORDER — ONDANSETRON HCL 4 MG/2ML IJ SOLN
INTRAMUSCULAR | Status: DC | PRN
  Administered 2014-02-15: 4 mg via INTRAVENOUS

## 2014-02-15 MED ORDER — DIPHENHYDRAMINE HCL 50 MG/ML IJ SOLN
INTRAMUSCULAR | Status: DC | PRN
  Administered 2014-02-15: 12.5 mg via INTRAVENOUS

## 2014-02-15 MED ORDER — HYDROMORPHONE HCL PF 1 MG/ML IJ SOLN
0.2500 mg | INTRAMUSCULAR | Status: DC | PRN
  Administered 2014-02-15 (×2): 0.5 mg via INTRAVENOUS

## 2014-02-15 MED ORDER — OXYMETAZOLINE HCL 0.05 % NA SOLN
NASAL | Status: DC | PRN
  Administered 2014-02-15: 1 via NASAL

## 2014-02-15 MED ORDER — SUCCINYLCHOLINE CHLORIDE 20 MG/ML IJ SOLN
INTRAMUSCULAR | Status: DC | PRN
  Administered 2014-02-15: 50 mg via INTRAVENOUS

## 2014-02-15 MED ORDER — DEXAMETHASONE SODIUM PHOSPHATE 4 MG/ML IJ SOLN
INTRAMUSCULAR | Status: DC | PRN
  Administered 2014-02-15: 10 mg via INTRAVENOUS

## 2014-02-15 MED ORDER — CHLORHEXIDINE GLUCONATE 4 % EX LIQD: 1.0000 "application " | Freq: Once | CUTANEOUS | Status: DC

## 2014-02-15 MED ORDER — CEFAZOLIN SODIUM-DEXTROSE 2-3 GM-% IV SOLR
INTRAVENOUS | Status: DC | PRN
  Administered 2014-02-15: 2 g via INTRAVENOUS

## 2014-02-15 MED ORDER — SCOPOLAMINE 1 MG/3DAYS TD PT72
MEDICATED_PATCH | TRANSDERMAL | Status: DC | PRN
  Administered 2014-02-15: 1 via TRANSDERMAL

## 2014-02-15 MED ORDER — LACTATED RINGERS IV SOLN
INTRAVENOUS | Status: DC
  Administered 2014-02-15: 10 mL/h via INTRAVENOUS
  Administered 2014-02-15: 09:00:00 via INTRAVENOUS

## 2014-02-15 MED ORDER — PROPOFOL 10 MG/ML IV BOLUS
INTRAVENOUS | Status: DC | PRN
  Administered 2014-02-15: 150 mg via INTRAVENOUS
  Administered 2014-02-15: 100 mg via INTRAVENOUS

## 2014-02-15 MED ORDER — LIDOCAINE HCL (CARDIAC) 20 MG/ML IV SOLN
INTRAVENOUS | Status: DC | PRN
  Administered 2014-02-15: 50 mg via INTRAVENOUS

## 2014-02-15 MED ORDER — MIDAZOLAM HCL 2 MG/2ML IJ SOLN
INTRAMUSCULAR | Status: AC
  Filled 2014-02-15: qty 2

## 2014-02-15 MED ORDER — CEFAZOLIN SODIUM-DEXTROSE 2-3 GM-% IV SOLR: 2.0000 g | INTRAVENOUS | Status: DC

## 2014-02-15 MED ORDER — BACITRACIN-NEOMYCIN-POLYMYXIN OINTMENT TUBE
TOPICAL_OINTMENT | CUTANEOUS | Status: DC | PRN
  Administered 2014-02-15: 1 via TOPICAL

## 2014-02-15 MED ORDER — FENTANYL CITRATE 0.05 MG/ML IJ SOLN
INTRAMUSCULAR | Status: AC
  Filled 2014-02-15: qty 6

## 2014-02-15 MED ORDER — PROPOFOL INFUSION 10 MG/ML OPTIME
INTRAVENOUS | Status: DC | PRN
  Administered 2014-02-15: 120 ug/kg/min via INTRAVENOUS

## 2014-02-15 MED ORDER — MIDAZOLAM HCL 5 MG/5ML IJ SOLN
INTRAMUSCULAR | Status: DC | PRN
  Administered 2014-02-15: 2 mg via INTRAVENOUS

## 2014-02-15 MED ORDER — OXYCODONE HCL 5 MG PO TABS
5.0000 mg | ORAL_TABLET | Freq: Once | ORAL | Status: AC | PRN
  Administered 2014-02-15: 5 mg via ORAL

## 2014-02-15 MED ORDER — LIDOCAINE-EPINEPHRINE 1 %-1:100000 IJ SOLN
INTRAMUSCULAR | Status: DC | PRN
  Administered 2014-02-15: 6 mL
  Administered 2014-02-15: 10 mL

## 2014-02-15 MED ORDER — SCOPOLAMINE 1 MG/3DAYS TD PT72
MEDICATED_PATCH | TRANSDERMAL | Status: AC
  Filled 2014-02-15: qty 1

## 2014-02-15 MED ORDER — OXYCODONE HCL 5 MG PO TABS
ORAL_TABLET | ORAL | Status: AC
  Filled 2014-02-15: qty 1

## 2014-02-15 MED ORDER — HYDROMORPHONE HCL PF 1 MG/ML IJ SOLN
INTRAMUSCULAR | Status: AC
  Filled 2014-02-15: qty 1

## 2014-02-15 SURGICAL SUPPLY — 40 items
AIRWAY NASO PHAR 26FR 6.5 (TUBING)
AIRWAY NASOPHAR 26 6.5 (TUBING) IMPLANT
ATTRACTOMAT 16X20 MAGNETIC DRP (DRAPES) IMPLANT
CANISTER SUCT 1200ML W/VALVE (MISCELLANEOUS) ×3 IMPLANT
COAGULATOR SUCT 8FR VV (MISCELLANEOUS) ×3 IMPLANT
DECANTER SPIKE VIAL GLASS SM (MISCELLANEOUS) IMPLANT
DEPRESSOR TONGUE BLADE STERILE (MISCELLANEOUS) ×6 IMPLANT
DRSG NASOPORE 8CM (GAUZE/BANDAGES/DRESSINGS) ×3 IMPLANT
DRSG TELFA 3X8 NADH (GAUZE/BANDAGES/DRESSINGS) ×3 IMPLANT
ELECT REM PT RETURN 9FT ADLT (ELECTROSURGICAL) ×3
ELECTRODE REM PT RTRN 9FT ADLT (ELECTROSURGICAL) ×1 IMPLANT
GAUZE PACKING FOLDED 2  STR (GAUZE/BANDAGES/DRESSINGS) ×2
GAUZE PACKING FOLDED 2 STR (GAUZE/BANDAGES/DRESSINGS) ×1 IMPLANT
GLOVE BIO SURGEON STRL SZ 6.5 (GLOVE) ×4 IMPLANT
GLOVE BIO SURGEONS STRL SZ 6.5 (GLOVE) ×2
GLOVE BIOGEL PI IND STRL 7.0 (GLOVE) ×3 IMPLANT
GLOVE BIOGEL PI INDICATOR 7.0 (GLOVE) ×6
GLOVE ECLIPSE 8.0 STRL XLNG CF (GLOVE) ×6 IMPLANT
GOWN STRL REUS W/ TWL LRG LVL3 (GOWN DISPOSABLE) ×2 IMPLANT
GOWN STRL REUS W/ TWL XL LVL3 (GOWN DISPOSABLE) ×1 IMPLANT
GOWN STRL REUS W/TWL LRG LVL3 (GOWN DISPOSABLE) ×4
GOWN STRL REUS W/TWL XL LVL3 (GOWN DISPOSABLE) ×2
NEEDLE HYPO 25X1 1.5 SAFETY (NEEDLE) ×3 IMPLANT
NEEDLE SPNL 25GX3.5 QUINCKE BL (NEEDLE) ×3 IMPLANT
NS IRRIG 1000ML POUR BTL (IV SOLUTION) ×3 IMPLANT
PACK BASIN DAY SURGERY FS (CUSTOM PROCEDURE TRAY) ×3 IMPLANT
PACK ENT DAY SURGERY (CUSTOM PROCEDURE TRAY) ×3 IMPLANT
PATTIES SURGICAL .5 X3 (DISPOSABLE) ×3 IMPLANT
SLEEVE SCD COMPRESS KNEE MED (MISCELLANEOUS) ×3 IMPLANT
SPONGE GAUZE 2X2 8PLY STER LF (GAUZE/BANDAGES/DRESSINGS) ×1
SPONGE GAUZE 2X2 8PLY STRL LF (GAUZE/BANDAGES/DRESSINGS) ×2 IMPLANT
SUT CHROMIC 3 0 PS 2 (SUTURE) IMPLANT
SUT CHROMIC 4 0 P 3 18 (SUTURE) ×3 IMPLANT
SUT ETHILON 3 0 PS 1 (SUTURE) ×3 IMPLANT
SUT PDS AB 4-0 P3 18 (SUTURE) ×3 IMPLANT
SUT PLAIN 4 0 ~~LOC~~ 1 (SUTURE) IMPLANT
SUT VIC AB 3-0 FS2 27 (SUTURE) IMPLANT
TOWEL OR 17X24 6PK STRL BLUE (TOWEL DISPOSABLE) ×3 IMPLANT
TRAY DSU PREP LF (CUSTOM PROCEDURE TRAY) ×3 IMPLANT
YANKAUER SUCT BULB TIP NO VENT (SUCTIONS) ×3 IMPLANT

## 2014-02-15 NOTE — Op Note (Signed)
02/15/2014  10:52 AM    Myrlene Broker  [id number]   Pre-Op Dx:  Deviated Nasal Septum, Hypertrophic Inferior Turbinates, Concha Bullosa bilateral middle turbinates  Post-op Dx: Same  Proc: Nasal Septoplasty, Bilateral SMR Inferior Turbinates, partial excision Bilateral middle turbinates   Surg:  Flo Shanks T MD  Anes:  GOT  EBL:  mon  Comp:  none  Findings:  Narrow overall nose.  RIGHT chondroethmoid spur abutting lateral nasal wall.  Bulky inferior turbinates bilat.  Large conchal cell of the anterior middle turbinate on each side.  Procedure: With the patient in a comfortable supine position,  general orotracheal anesthesia was induced without difficulty.     Intravenous prophylactic antibiotics were administered.  At an appropriate level, the patient was placed in a semi-sitting position.  A saline moistened throat pack was placed.  Nasal vibrissae were trimmed.  Afrin solution was applied on 1/2" X 3" cottonoids to both sides of the nasal septum.   1% Xylocaine with 1:100,000 epinephrine, 10 cc's, was infiltrated into the anterior floor of the nose, into the nasal spine region, into the membranous columella, and finally into the submucoperichondrial plane of the septum on both sides.  Several minutes were allowed for this to take effect.  A sterile preparation and draping of the midface was accomplished in the standard fashion.  The materials were removed from the nose and observed to be intact and correct in number.  The nose was inspected with a headlight with the findings as described above.  A LEFT  hemitransfixion incision was sharply executed and carried down to the caudal edge of the quadrangular cartilage and continued to a floor incision.  An opposite small floor incision was sharply executed as well.   Floor tunnels were elevated on both sides, carried posteriorly, then medially, then brought forward along the vomer and maxillary crest.  The submucoperichondrial  plane of the  LEFT septum was dissected up to the dorsum of the nose, back onto the perpendicular plate, and brought down and communicated with a floor tunnel and then forward along the maxillary crest.  The flap was generated intact.  The chondroethmoid junction was identified and opened with a Risk analyst.  The opposite submucoperiosteal plane of the perpendicular plate of the ethmoid  was elevated and carried down to the floor tunnel posteriorly.  The superior perpendicular plate was lysed with an open Jansen-Middleton forceps.  The inferior portion was dissected from the maxillary crest and vomer with a Cottle elevator.  The midportion was rocked free with a closed [employer] forceps and then delivered.    The posterior inferior corner of the quadrangular cartilage was submucosally resected, including a cartilaginous tail up along the vomer.     The inferior 1-2 mm of the caudal strut was submucosally dissected and removed.  This allowed the septum to trapdoor effectively into the midline.    After mobilizing the septum adequately, and straightening it in the standard fashion,  The septum was secured to the nasal spine with a figure-of-eight 4-0 PDS suture.  A good straight midline configuration of the septum with good dorsal support was generated.  The septal tunnel was suctioned clear.  Hemostasis was observed.  The flaps were laid back down.  The incisions were closed with interrupted 4-0 chromic suture.  Just prior to completing the septoplasty, the middle and  inferior turbinates were each infiltrated with additional 1% Xylocaine with 1:100,000 epinephrine,  6 cc's total.  Upon completing the septoplasty, beginning on  the RIGHT side, the inferior turbinate was inspected and infractured.  The anterior hood of the inferior turbinate was sharply lysed just behind the nasal valve.  The medial mucosa of the inferior turbinate was incised in an  anterior upsloping fashion and a laterally  based flap was developed from the turbinate bone.  Using angled turbinate scissors, turbinate bone and lateral mucosa were resected in a posterior downsloping fashion, taking much of the anterior pole and leaving most of the posterior pole.  Bony spicules were submucosally dissected and removed.  The mucosal flap was laid back down and the turbinate was outfractured.  This completed one SMR inferior turbinate.  The opposite side was performed in identical fashion.  The bulbous posterior poles of the turbinates, and the cut mucosal edges were suction coagulated for hemostasis.    The inferior edge of the middle turbinate was sharply incised on each side, entering the conchal cell.  The lateral bone and mucosa of the middle turbinate where it was narrowing the middle meatus was resected using Open Langston Masker forcepts, and also Grunwald forceps.  The medial bone and mucosa were left intact and had good structural stability.    Again hemostasis was observed.  After completing both turbinate resections, 0.040" reinforced Silastic splints were fashioned, placed against the nasal septum for support, and secured thereto with a 3-0 Ethilon stitch. A small piece of NasaPore packing was placed into the middle meatus on each side.    Telfa packs impregnated with bacitracin ointment were placed between the septum and the inferior turbinates, one on each side, for hemostasis and support.     At this point the procedure was completed.  The pharynx was suctioned free and the throat pack was removed.   The patient was returned to anesthesia, awakened, extubated, and transferred to recovery in stable condition.  Dispo:   PACU to home  Plan: Ice, elevation, narcotic analgesia, prophylactic antibiotics for the duration of indwelling nasal foreign bodies.  We will remove the nasal packing In one day, the septal splints in 10 days.  Return to work or school in 10 days, strenuous activities in two weeks.  Cephus Richer MD

## 2014-02-15 NOTE — Transfer of Care (Signed)
Immediate Anesthesia Transfer of Care Note  Patient: Erin Phelps  Procedure(s) Performed: Procedure(s): NASAL SEPTOPLASTY WITH TURBINATE REDUCTION AND PARTIAL EXCISION MIDDLE TURBINATE  (N/A)  Patient Location: PACU  Anesthesia Type:General  Level of Consciousness: awake, alert , oriented and patient cooperative  Airway & Oxygen Therapy: Patient Spontanous Breathing and Patient connected to face mask oxygen  Post-op Assessment: Report given to PACU RN and Post -op Vital signs reviewed and stable  Post vital signs: Reviewed and stable  Complications: No apparent anesthesia complications

## 2014-02-15 NOTE — Interval H&P Note (Signed)
History and Physical Interval Note:  02/15/2014 8:47 AM  Erin Phelps  has presented today for surgery, with the diagnosis of DEVIATED NASAL HYPERTROPHIC INFERIOR TURBINATE  CONCHA BILATERAL MIDDLE TURBINATE   The various methods of treatment have been discussed with the patient and family. After consideration of risks, benefits and other options for treatment, the patient has consented to  Procedure(s): NASAL SEPTOPLASTY WITH TURBINATE REDUCTION AND PARTIAL EXCISION MIDDLE TURBINATE  (N/A) as a surgical intervention .  The patient's history has been re-reviewed, patient re-examined, no change in status, stable for surgery.  I have re-reviewed the patient's chart and labs.  Questions were answered to the patient's satisfaction.     Flo Shanks

## 2014-02-15 NOTE — Anesthesia Procedure Notes (Signed)
Procedure Name: Intubation Date/Time: 02/15/2014 9:18 AM Performed by: [place] Desanctis Pre-anesthesia Checklist: Patient identified, Emergency Drugs available, Suction available and Patient being monitored Patient Re-evaluated:Patient Re-evaluated prior to inductionOxygen Delivery Method: Circle System Utilized Preoxygenation: Pre-oxygenation with 100% oxygen Intubation Type: IV induction Ventilation: Mask ventilation without difficulty Laryngoscope Size: Miller and 3 Tube type: Oral Tube size: 7.0 mm Number of attempts: 1 Airway Equipment and Method: stylet and oral airway Placement Confirmation: ETT inserted through vocal cords under direct vision,  positive ETCO2 and breath sounds checked- equal and bilateral Tube secured with: Tape Dental Injury: Teeth and Oropharynx as per pre-operative assessment

## 2014-02-15 NOTE — Discharge Instructions (Signed)
Nasal Septal Reconstruction °Nasal septal reconstruction or nasal septoplasty is a procedure to straighten the nasal septum. The nasal septum is a wall that separates the two nostrils and nasal passages. It is slightly off center in most people. If the septum is severely deviated, it may result in problems, such as difficulty in breathing through the nose. The bend in the septum may be present at birth or could be due to an injury. This procedure is done if you have any of the following problems. °· Deviation of the nasal septum. °· Repeated infection of the sinuses (air-filled cavities in the skull). °· Pain due to the deviated septum. °· Loss of smell due to the deviated septum. °· A blood clot in the septum that interferes your breathing. °· Frequent nosebleeds. °If the outside of the nose is bent, it may have to be reconstructed by a surgery called rhinoplasty. Sometimes, this procedure may be combined with septoplasty. °LET YOUR CAREGIVER KNOW ABOUT:  °· Allergies. °· Previous problems with anesthetics. °· History of bleeding or blood problems. °· Any medicines that you are currently taking. °RISKS AND COMPLICATIONS °· You may have a hole in the septum. °· You may have a collection of blood in the septum. °· You may develop loss of sensation in the upper lip or teeth. °· You may develop an infection. °· You may have bleeding. °· The front portion of your nose may become flatter than what it was before the procedure. °· You may develop a reaction to the anesthetic used. °· You may have a recurrence of the nasal obstruction. °BEFORE THE PROCEDURE  °· Follow the instructions given by your caregiver. °· Your caregiver may recommend x-ray and blood tests. °· Your caregiver may advise you to stop smoking for at least 2 weeks before the procedure. °· Your caregiver may advise you to stop taking aspirin and anti-inflammatory medications such as ibuprofen, 10 days before surgery, as these medicines can cause  bleeding. °If the surgery is going to be under general anesthesia: °· You may be advised to eat only a light meal, such as soup or salad the previous night. °· You will be advised to avoid eating or drinking anything after midnight and also in the morning of the procedure. °PROCEDURE  °If the procedure is being done under general anesthesia, you may be put to sleep. You will not feel the pain. You will not be aware of the procedure. It can also be done under local anesthesia with sedation where the area of the surgery is numbed. The surgeon then makes a cut on the inner lining of the septum. If there is a blood clot, it is drained. The bone and cartilage of the septum are reshaped. The straightened septum is held in place using plastic sheets or splints. Your nose is then packed with gauze to control the bleeding. The procedure may take one to one and a half hours. It generally does not cause bruising or black eyes. °AFTER THE PROCEDURE  °· You may be kept in the recovery room till the effect of the anesthesia wears off. °· You may be then brought to your room in the hospital. °· You may be asked to breathe through your mouth. °· Your nose packing may need to stay in place for 3 to 4 days. °· You may be given medicines for discomfort and nausea. °· You may be given antibiotics. °· You may be allowed to go home on the same day or have to   stay in the hospital for a few days. HOME CARE INSTRUCTIONS   Do not blow your nose.  Avoid doing heavy work and strenuous exercise for at least one week after the procedure.  Avoid pushing or moving your nose before it heals.  Avoid lifting weight and bending forwards.  Avoid using products that contain aspirin.  Keep your head raised while lying down.  Take the medicines as instructed by your caregiver.  Inform your caregiver if you have any problems after taking your medicine. SEEK MEDICAL CARE IF:   You have a new symptom.  You have doubts regarding the  procedure or its outcome. SEEK IMMEDIATE MEDICAL CARE IF:   You develop fever over 102 F (38.9 C).  You have severe difficulty in breathing.  Your nose continues to bleed even after you keep your head raised and apply ice to your forehead and nose for 10 to 15 minutes. Document Released: 08/28/2007 Document Revised: 08/13/2011 Document Reviewed: 08/28/2007 [facility] Patient Information 2015 [place], [place]. This information is not intended to replace advice given to you by your health care provider. Make sure you discuss any questions you have with your health care provider.  We will remove your nasal packs tomorrow.  Take a dose of pain medication before this appointment Keep your head elevated 3-4 nights Ice pack x 24 hrs as comfortable Drip pad and change as needed.  You may change this quite frequently the first 12 hours or so.   Call for problems or questions, [phone]   Post Anesthesia Home Care Instructions  Activity: Get plenty of rest for the remainder of the day. A responsible adult should stay with you for 24 hours following the procedure.  For the next 24 hours, DO NOT: -Drive a car -Advertising copywriter -Drink alcoholic beverages -Take any medication unless instructed by your physician -Make any legal decisions or sign important papers.  Meals: Start with liquid foods such as gelatin or soup. Progress to regular foods as tolerated. Avoid greasy, spicy, heavy foods. If nausea and/or vomiting occur, drink only clear liquids until the nausea and/or vomiting subsides. Call your physician if vomiting continues.  Special Instructions/Symptoms: Your throat may feel dry or sore from the anesthesia or the breathing tube placed in your throat during surgery. If this causes discomfort, gargle with warm salt water. The discomfort should disappear within 24 hours.  Post Anesthesia Home Care Instructions  Activity: Get plenty of rest for the remainder of the day. A responsible  adult should stay with you for 24 hours following the procedure.  For the next 24 hours, DO NOT: -Drive a car -Advertising copywriter -Drink alcoholic beverages -Take any medication unless instructed by your physician -Make any legal decisions or sign important papers.  Meals: Start with liquid foods such as gelatin or soup. Progress to regular foods as tolerated. Avoid greasy, spicy, heavy foods. If nausea and/or vomiting occur, drink only clear liquids until the nausea and/or vomiting subsides. Call your physician if vomiting continues.  Special Instructions/Symptoms: Your throat may feel dry or sore from the anesthesia or the breathing tube placed in your throat during surgery. If this causes discomfort, gargle with warm salt water. The discomfort should disappear within 24 hours.

## 2014-02-15 NOTE — H&P (View-Only) (Signed)
Erin, Phelps 38 y.o., female [id number]     Chief Complaint:  Recurrent sinus infections, nasal obstruction, possible rhinogenic headaches  HPI: 6 months return visit for this 38 year old white female.  She saw Dr. Jearld Fenton last spring.  She had facial pressure and nasal congestion.  A CT scan showed narrow airways, large inferior turbinates.  A conchal cell of each middle turbinate, and a somewhat narrow but patent ostiomeatal complex on each side.  She does have a mid posterior chondroethmoid spur on the RIGHT side which may be causing deep headaches.  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  Since that time she has been on QNasl, and also either Allegra or Claritin daily.  She feels like she is under better control from what she had previously thought was a history of recurrent sinus infections.  She does not think she has much in the way of seasonal allergies.  She did have a routine upper respiratory infection in June including headaches which settled down.  With Dr. Jearld Fenton, she tried Afrin once or twice which did seem to open her nose, and also relieve the headache.   2 weeks ago, she seemed to have another sinus infection with bifrontal and bimaxillary discomfort, nasal congestion, and some brownish nasal drainage, RIGHT more than LEFT.  No vision changes.  No documented fever.  No ear problems.  She was treated with Levaquin, prednisone, Afrin for 2 days, and continued her QNasl.  At that time she call for an appointment, she did not think she was getting better.  She has now been off the Levaquin for roughly 8 days and does think she is generally mostly improved.  She is aware that she is never much of a nose breather.  She does not think she has active snoring according to her husband.  Nothing to suggest sleep apnea.  No prior history of nasal trauma or nasal surgery.  PMH: Past Medical History  Diagnosis Date  . Complication of anesthesia     hx. of being hard to wake up  post-op  . Family history of anesthesia complication     pt's mother has hx. of being hard to wake up post-op  . Dental crowns present     also dental caps  . Deviated nasal septum 02/2014  . Hypertrophy of nasal turbinates 02/2014    Surg Hx: Past Surgical History  Procedure Laterality Date  . Hernia repair  age 25 mos.    diaphramatic  . Wisdom tooth extraction      FHx:   Family History  Problem Relation Age of Onset  . Anesthesia problems Mother     hard to wake up post-op   SocHx:  reports that she has been smoking E-cigarettes.  She has a 7.5 pack-year smoking history. She has never used smokeless tobacco. She reports that she does not drink alcohol or use illicit drugs.  ALLERGIES: No Known Allergies   (Not in a hospital admission)  No results found for this or any previous visit (from the past 48 hour(s)). No results found.    Last menstrual period 01/25/2014.  PHYSICAL EXAM: She is trim and healthy.  Mental status is sharp.  She hears well in conversational speech.  Voice is clear and respirations unlabored through nose and mouth.  The head is atraumatic and neck supple.  Cranial nerves intact.  Ear canals are clear with normal drums.  The internal nose is overall narrow with bulky inferior and middle  turbinates.  There is a moderate spur posteriorly on the RIGHT side.  When I touched this with the end of the Q-tip, she feels like this does reproduce her pain.  No polyps or active drainage.  Oral cavity and pharynx clear.  Neck unremarkable.   Lungs: Clear to auscultation Heart: Regular rate and rhythm without murmur Abdomen: Soft, active Studies: Normal configuration Neurologic: Symmetric, grossly intact.  Studies Reviewed:  CT sinuses, MAR 2015    Assessment/Plan Chronic rhinitis (472.0) (J31.0). Allergic rhinitis due to other allergen (477.8) (J30.9). Deviated nasal septum (470) (J34.2). Headache (784.0) (R51). Hypertrophy of nasal turbinates (478.0)  (J34.3).  Your nose is overall somewhat narrow.  If you did have a sinus infection 2 weeks ago, I do think you have gotten over it.  I think between the narrow nose which does not breathe very well, and the RIGHT nasal septal spur which may be causing headaches, I recommend a nasal septoplasty, and reduction of the inferior and middle turbinates to open up your nasal breathing passages and your sinuses.  Anticipate return to work around 7-10 days.  I will see you  back here in the office one and again 10 days after surgery.  I am giving you a prescription for Vicodin for pain relief, and Keflex antibiotics for after surgery.  Cephalexin 500 MG Oral Capsule;TAKE 1 CAPSULE 4 TIMES DAILY; Qty40; R0; Rx. Hydrocodone-Acetaminophen 5-325 MG Oral Tablet;1-2 po q4-6h prn pain; Qty40; R0; Rx.  Erin Phelps, Erin Phelps 02/11/2014, 1:18 PM

## 2014-02-15 NOTE — Anesthesia Postprocedure Evaluation (Signed)
  Anesthesia Post-op Note  Patient: Erin Phelps  Procedure(s) Performed: Procedure(s): NASAL SEPTOPLASTY WITH TURBINATE REDUCTION AND PARTIAL EXCISION MIDDLE TURBINATE  (N/A)  Patient Location: PACU  Anesthesia Type: General   Level of Consciousness: awake, alert  and oriented  Airway and Oxygen Therapy: Patient Spontanous Breathing  Post-op Pain: mild  Post-op Assessment: Post-op Vital signs reviewed  Post-op Vital Signs: Reviewed  Last Vitals:  Filed Vitals:   02/15/14 1215  BP: 178/102  Pulse: 76  Temp:   Resp: 13    Complications: No apparent anesthesia complications

## 2014-02-15 NOTE — Anesthesia Preprocedure Evaluation (Addendum)
Anesthesia Evaluation  Patient identified by MRN, date of birth, ID band Patient awake    Reviewed: Allergy & Precautions, H&P , NPO status , Patient's Chart, lab work & pertinent test results  History of Anesthesia Complications (+) PROLONGED EMERGENCE and history of anesthetic complications  Airway Mallampati: II TM Distance: >3 FB Neck ROM: Full    Dental no notable dental hx. (+) Teeth Intact, Dental Advisory Given   Pulmonary Current Smoker,  breath sounds clear to auscultation  Pulmonary exam normal       Cardiovascular negative cardio ROS  Rhythm:Regular Rate:Normal     Neuro/Psych negative neurological ROS  negative psych ROS   GI/Hepatic negative GI ROS, Neg liver ROS,   Endo/Other  negative endocrine ROS  Renal/GU negative Renal ROS  negative genitourinary   Musculoskeletal   Abdominal   Peds  Hematology negative hematology ROS (+)   Anesthesia Other Findings   Reproductive/Obstetrics negative OB ROS                          Anesthesia Physical Anesthesia Plan  ASA: II  Anesthesia Plan: General   Post-op Pain Management:    Induction: Intravenous  Airway Management Planned: Oral ETT  Additional Equipment:   Intra-op Plan:   Post-operative Plan: Extubation in OR  Informed Consent: I have reviewed the patients History and Physical, chart, labs and discussed the procedure including the risks, benefits and alternatives for the proposed anesthesia with the patient or authorized representative who has indicated his/her understanding and acceptance.   Dental advisory given  Plan Discussed with: CRNA and Surgeon  Anesthesia Plan Comments:         Anesthesia Quick Evaluation

## 2014-02-16 ENCOUNTER — Encounter (HOSPITAL_BASED_OUTPATIENT_CLINIC_OR_DEPARTMENT_OTHER): Payer: Self-pay | Admitting: Otolaryngology

## 2014-03-04 HISTORY — PX: CYST REMOVAL NECK: SHX6281

## 2014-03-29 NOTE — Progress Notes (Signed)
Please put orders in Epic surgery 04-02-14 pre op 04-01-14 Thanks

## 2014-03-30 ENCOUNTER — Ambulatory Visit (INDEPENDENT_AMBULATORY_CARE_PROVIDER_SITE_OTHER): Payer: Self-pay | Admitting: General Surgery

## 2014-03-30 ENCOUNTER — Encounter (HOSPITAL_COMMUNITY): Payer: Self-pay | Admitting: Pharmacy Technician

## 2014-03-31 NOTE — Patient Instructions (Addendum)
Erin BrokerJamie Phelps  03/31/2014                           YOUR PROCEDURE IS SCHEDULED ON:  04/02/14                ENTER FROM [address] - GO TO [address]ARK[address] DECK               LOOK FOR [facility]  / [employer]                              FOLLOW  SIGNS TO SHORT STAY CENTER                 ARRIVE AT SHORT STAY AT:  11:15 AM               CALL THIS NUMBER IF ANY PROBLEMS THE DAY OF SURGERY :               [phone]                                REMEMBER:   Do not eat food  AFTER MIDNIGHT            MAY HAVE CLEAR LIQUIDS UNTIL 7:15 AM     CLEAR LIQUID DIET   Foods Allowed                                                                     Foods Excluded  Coffee and tea, regular and decaf                             liquids that you cannot  Plain Jell-O in any flavor                                             see through such as: Fruit ices (not with fruit pulp)                                     milk, soups, orange juice  Iced Popsicles                                    All solid food Carbonated beverages, regular and diet                                    Cranberry, grape and apple juices Sports drinks like Gatorade Lightly seasoned clear broth or consume(fat free) Sugar, honey syrup  _____________________________________________________________________                    Take these medicines the morning of surgery with  A SIPS OF WATER :      none   Do not wear jewelry, make-up   Do not wear lotions, powders, or perfumes.   Do not shave legs or underarms 12 hrs. before surgery (men may shave face)  Do not bring valuables to the hospital.  Contacts, dentures or bridgework may not be worn into surgery.  Leave suitcase in the car. After surgery it may be brought to your room.  For patients admitted to the hospital more than one night, checkout time is            11:00 AM                                                       The day of  discharge.   Patients discharged the day of surgery will not be allowed to drive home.            If going home same day of surgery, must have someone stay with you              FIRST 24 hrs at home and arrange for some one to drive you              home from hospital.   ________________________________________________________________________                                                                                                  [facility] - PREPARING FOR SURGERY  Before surgery, you can play an important role.  Because skin is not sterile, your skin needs to be as free of germs as possible.  You can reduce the number of germs on your skin by washing with CHG (chlorahexidine gluconate) soap before surgery.  CHG is an antiseptic cleaner which kills germs and bonds with the skin to continue killing germs even after washing. Please DO NOT use if you have an allergy to CHG or antibacterial soaps.  If your skin becomes reddened/irritated stop using the CHG and inform your nurse when you arrive at Short Stay. Do not shave (including legs and underarms) for at least 48 hours prior to the first CHG shower.  You may shave your face. Please follow these instructions carefully:   1.  Shower with CHG Soap the night before surgery and the  morning of Surgery.   2.  If you choose to wash your hair, wash your hair first as usual with your  normal  Shampoo.   3.  After you shampoo, rinse your hair and body thoroughly to remove the  shampoo.                                         4.  Use CHG as you would any other liquid soap.  You can apply chg directly  to the skin  and wash . Gently wash with scrungie or clean wascloth    5.  Apply the CHG Soap to your body ONLY FROM THE NECK DOWN.   Do not use on open                           Wound or open sores. Avoid contact with eyes, ears mouth and genitals (private parts).                        Genitals (private parts) with your normal soap.               6.  Wash thoroughly, paying special attention to the area where your surgery  will be performed.   7.  Thoroughly rinse your body with warm water from the neck down.   8.  DO NOT shower/wash with your normal soap after using and rinsing off  the CHG Soap .                9.  Pat yourself dry with a clean towel.             10.  Wear clean pajamas.             11.  Place clean sheets on your bed the night of your first shower and do not  sleep with pets.  Day of Surgery : Do not apply any lotions/deodorants the morning of surgery.  Please wear clean clothes to the hospital/surgery center.  FAILURE TO FOLLOW THESE INSTRUCTIONS MAY RESULT IN THE CANCELLATION OF YOUR SURGERY    PATIENT SIGNATURE_________________________________  ______________________________________________________________________

## 2014-04-01 ENCOUNTER — Encounter (HOSPITAL_COMMUNITY)
Admission: RE | Admit: 2014-04-01 | Discharge: 2014-04-01 | Disposition: A | Discharge status: Home / Self Care | Payer: 59 | Source: Ambulatory Visit | Attending: General Surgery | Admitting: General Surgery

## 2014-04-01 ENCOUNTER — Encounter (HOSPITAL_COMMUNITY): Payer: Self-pay

## 2014-04-01 DIAGNOSIS — L723 Sebaceous cyst: Secondary | ICD-10-CM | POA: Diagnosis present

## 2014-04-01 DIAGNOSIS — F17210 Nicotine dependence, cigarettes, uncomplicated: Secondary | ICD-10-CM | POA: Diagnosis not present

## 2014-04-01 HISTORY — DX: Elevated blood-pressure reading, without diagnosis of hypertension: R03.0

## 2014-04-01 LAB — HCG, SERUM, QUALITATIVE: PREG SERUM: NEGATIVE

## 2014-04-01 LAB — CBC
HCT: 35.1 % — ABNORMAL LOW (ref 36.0–46.0)
Hemoglobin: 12.7 g/dL (ref 12.0–15.0)
MCH: 30.8 pg (ref 26.0–34.0)
MCHC: 36.2 g/dL — ABNORMAL HIGH (ref 30.0–36.0)
MCV: 85 fL (ref 78.0–100.0)
PLATELETS: 173 10*3/uL (ref 150–400)
RBC: 4.13 MIL/uL (ref 3.87–5.11)
RDW: 12.4 % (ref 11.5–15.5)
WBC: 7.5 10*3/uL (ref 4.0–10.5)

## 2014-04-02 ENCOUNTER — Encounter (HOSPITAL_COMMUNITY): Payer: Self-pay

## 2014-04-02 ENCOUNTER — Ambulatory Visit (HOSPITAL_COMMUNITY): Payer: 59 | Admitting: Anesthesiology

## 2014-04-02 ENCOUNTER — Ambulatory Visit (HOSPITAL_COMMUNITY)
Admission: RE | Admit: 2014-04-02 | Discharge: 2014-04-02 | Disposition: A | Discharge status: Home / Self Care | Payer: 59 | Source: Ambulatory Visit | Attending: General Surgery | Admitting: General Surgery

## 2014-04-02 ENCOUNTER — Encounter (HOSPITAL_COMMUNITY): Payer: 59 | Admitting: Anesthesiology

## 2014-04-02 ENCOUNTER — Encounter (HOSPITAL_COMMUNITY)
Admission: RE | Disposition: A | Discharge status: Home / Self Care | Payer: Self-pay | Source: Ambulatory Visit | Attending: General Surgery

## 2014-04-02 DIAGNOSIS — F17210 Nicotine dependence, cigarettes, uncomplicated: Secondary | ICD-10-CM | POA: Insufficient documentation

## 2014-04-02 DIAGNOSIS — L723 Sebaceous cyst: Secondary | ICD-10-CM | POA: Diagnosis not present

## 2014-04-02 HISTORY — PX: EAR CYST EXCISION: SHX22

## 2014-04-02 SURGERY — CYST REMOVAL
Anesthesia: Monitor Anesthesia Care | Site: Neck

## 2014-04-02 MED ORDER — SODIUM CHLORIDE 0.9 % IV SOLN
250.0000 mL | INTRAVENOUS | Status: DC | PRN
Start: 2014-04-02 — End: 2014-04-02

## 2014-04-02 MED ORDER — MIDAZOLAM HCL 2 MG/2ML IJ SOLN
INTRAMUSCULAR | Status: AC
  Filled 2014-04-02: qty 2

## 2014-04-02 MED ORDER — OXYCODONE HCL 5 MG PO TABS
5.0000 mg | ORAL_TABLET | ORAL | Status: DC | PRN
  Administered 2014-04-02: 5 mg via ORAL
  Filled 2014-04-02: qty 1

## 2014-04-02 MED ORDER — SODIUM CHLORIDE 0.9 % IR SOLN
Status: DC | PRN
  Administered 2014-04-02: 1000 mL

## 2014-04-02 MED ORDER — FENTANYL CITRATE 0.05 MG/ML IJ SOLN
INTRAMUSCULAR | Status: DC | PRN
  Administered 2014-04-02 (×2): 50 ug via INTRAVENOUS

## 2014-04-02 MED ORDER — CHLORHEXIDINE GLUCONATE 4 % EX LIQD: 1.0000 "application " | Freq: Once | CUTANEOUS | Status: DC

## 2014-04-02 MED ORDER — PROPOFOL 10 MG/ML IV BOLUS
INTRAVENOUS | Status: AC
  Filled 2014-04-02: qty 20

## 2014-04-02 MED ORDER — ACETAMINOPHEN 650 MG RE SUPP
650.0000 mg | RECTAL | Status: DC | PRN
  Filled 2014-04-02: qty 1

## 2014-04-02 MED ORDER — ACETAMINOPHEN 325 MG PO TABS: 650.0000 mg | ORAL_TABLET | ORAL | Status: DC | PRN

## 2014-04-02 MED ORDER — FENTANYL CITRATE 0.05 MG/ML IJ SOLN
50.0000 ug | INTRAMUSCULAR | Status: DC | PRN
  Administered 2014-04-02: 12.5 ug via INTRAVENOUS
  Filled 2014-04-02: qty 2

## 2014-04-02 MED ORDER — FENTANYL CITRATE 0.05 MG/ML IJ SOLN
25.0000 ug | INTRAMUSCULAR | Status: DC | PRN
  Administered 2014-04-02 (×2): 50 ug via INTRAVENOUS

## 2014-04-02 MED ORDER — LACTATED RINGERS IV SOLN
1000.0000 mL | INTRAVENOUS | Status: DC
  Administered 2014-04-02: 1000 mL via INTRAVENOUS

## 2014-04-02 MED ORDER — PROPOFOL 10 MG/ML IV BOLUS
INTRAVENOUS | Status: DC | PRN
  Administered 2014-04-02 (×2): 50 mg via INTRAVENOUS
  Administered 2014-04-02: 100 mg via INTRAVENOUS
  Administered 2014-04-02: 50 mg via INTRAVENOUS

## 2014-04-02 MED ORDER — FENTANYL CITRATE 0.05 MG/ML IJ SOLN
INTRAMUSCULAR | Status: AC
  Filled 2014-04-02: qty 2

## 2014-04-02 MED ORDER — CEFAZOLIN SODIUM-DEXTROSE 2-3 GM-% IV SOLR
INTRAVENOUS | Status: AC
  Filled 2014-04-02: qty 50

## 2014-04-02 MED ORDER — LIDOCAINE HCL (PF) 1 % IJ SOLN
INTRAMUSCULAR | Status: DC | PRN
  Administered 2014-04-02: 10 mL

## 2014-04-02 MED ORDER — MIDAZOLAM HCL 5 MG/5ML IJ SOLN
INTRAMUSCULAR | Status: DC | PRN
  Administered 2014-04-02: 2 mg via INTRAVENOUS

## 2014-04-02 MED ORDER — CEFAZOLIN SODIUM-DEXTROSE 2-3 GM-% IV SOLR
2.0000 g | INTRAVENOUS | Status: AC
  Administered 2014-04-02: 2 g via INTRAVENOUS

## 2014-04-02 MED ORDER — SODIUM CHLORIDE 0.9 % IJ SOLN
3.0000 mL | INTRAMUSCULAR | Status: DC | PRN
Start: 2014-04-02 — End: 2014-04-02

## 2014-04-02 MED ORDER — LIDOCAINE HCL 1 % IJ SOLN
INTRAMUSCULAR | Status: AC
Start: 2014-04-02 — End: 2014-04-02
  Filled 2014-04-02: qty 20

## 2014-04-02 MED ORDER — ONDANSETRON HCL 4 MG/2ML IJ SOLN: 4.0000 mg | Freq: Once | INTRAMUSCULAR | Status: DC | PRN

## 2014-04-02 MED ORDER — ONDANSETRON HCL 4 MG/2ML IJ SOLN
INTRAMUSCULAR | Status: DC | PRN
  Administered 2014-04-02: 4 mg via INTRAVENOUS

## 2014-04-02 MED ORDER — SODIUM CHLORIDE 0.9 % IJ SOLN: 3.0000 mL | Freq: Two times a day (BID) | INTRAMUSCULAR | Status: DC

## 2014-04-02 MED ORDER — OXYMETAZOLINE HCL 0.05 % NA SOLN
2.0000 | NASAL | Status: AC
  Filled 2014-04-02: qty 15

## 2014-04-02 MED ORDER — ONDANSETRON HCL 4 MG/2ML IJ SOLN
INTRAMUSCULAR | Status: AC
  Filled 2014-04-02: qty 2

## 2014-04-02 MED ORDER — OXYCODONE-ACETAMINOPHEN 5-325 MG PO TABS: 1.0000 | ORAL_TABLET | ORAL | Status: AC | PRN

## 2014-04-02 MED ORDER — BUPIVACAINE-EPINEPHRINE (PF) 0.25% -1:200000 IJ SOLN
INTRAMUSCULAR | Status: AC
  Filled 2014-04-02: qty 30

## 2014-04-02 SURGICAL SUPPLY — 29 items
BENZOIN TINCTURE PRP APPL 2/3 (GAUZE/BANDAGES/DRESSINGS) IMPLANT
BLADE HEX COATED 2.75 (ELECTRODE) ×3 IMPLANT
CANISTER SUCTION 2500CC (MISCELLANEOUS) ×3 IMPLANT
DECANTER SPIKE VIAL GLASS SM (MISCELLANEOUS) IMPLANT
DERMABOND ADVANCED (GAUZE/BANDAGES/DRESSINGS) ×2
DERMABOND ADVANCED .7 DNX12 (GAUZE/BANDAGES/DRESSINGS) ×1 IMPLANT
DRAPE LAPAROTOMY T 102X78X121 (DRAPES) IMPLANT
DRAPE LAPAROTOMY TRNSV 102X78 (DRAPE) IMPLANT
ELECT REM PT RETURN 9FT ADLT (ELECTROSURGICAL) ×3
ELECTRODE REM PT RTRN 9FT ADLT (ELECTROSURGICAL) ×1 IMPLANT
EVACUATOR SILICONE 100CC (DRAIN) IMPLANT
GAUZE SPONGE 4X4 12PLY STRL (GAUZE/BANDAGES/DRESSINGS) IMPLANT
GLOVE BIO SURGEON STRL SZ7.5 (GLOVE) ×6 IMPLANT
GLOVE BIOGEL PI IND STRL 7.0 (GLOVE) ×1 IMPLANT
GLOVE BIOGEL PI INDICATOR 7.0 (GLOVE) ×2
GOWN STRL REUS W/TWL XL LVL3 (GOWN DISPOSABLE) ×6 IMPLANT
KIT BASIN OR (CUSTOM PROCEDURE TRAY) ×3 IMPLANT
MARKER SKIN DUAL TIP RULER LAB (MISCELLANEOUS) IMPLANT
NEEDLE HYPO 25X1 1.5 SAFETY (NEEDLE) ×3 IMPLANT
NS IRRIG 1000ML POUR BTL (IV SOLUTION) ×3 IMPLANT
PACK GENERAL/GYN (CUSTOM PROCEDURE TRAY) ×3 IMPLANT
STAPLER VISISTAT 35W (STAPLE) IMPLANT
SUT MNCRL AB 4-0 PS2 18 (SUTURE) ×3 IMPLANT
SUT NYLON 3 0 (SUTURE) ×3 IMPLANT
SUT VIC AB 3-0 SH 18 (SUTURE) IMPLANT
SUT VIC AB 3-0 SH 27 (SUTURE) ×2
SUT VIC AB 3-0 SH 27X BRD (SUTURE) ×1 IMPLANT
SYR CONTROL 10ML LL (SYRINGE) ×3 IMPLANT
TOWEL OR 17X26 10 PK STRL BLUE (TOWEL DISPOSABLE) IMPLANT

## 2014-04-02 NOTE — H&P (Signed)
History of Present Illness Erin Phelps(Erin Blasius MD; 03/24/2014 1:59 PM) Patient words: Evaluate sebaceous cyst.  The patient is a 38 year old female who presents with a complaint of mass. Patient is a [age] her old female who is referred by by Dr.Kiorala for evaluation of a sebaceous cyst of his neck. Patient states this had been in for several months. It's tender. It is not ruptured. She has some pain.   Past Surgical History Erin Phelps(E[place]; 03/24/2014 1:46 PM) Sinus Surgery09/14/2015  Diagnostic Studies History Erin Phelps(E[place]; 03/24/2014 1:44 PM) Colonoscopy never Mammogram never Pap Smear 1-5 years ago  Allergies Erin Phelps(Erin [place]hel[place], [place]; 03/24/2014 1:44 PM) No Known Drug Allergies10/21/2015  Medication History Erin Phelps(E[place]; 03/24/2014 1:44 PM) Tylenol 8 Hour (650MG  Tablet ER, Oral) Active. Advil (200MG  Capsule, Oral) Active.  Social History Erin Phelps([place]; 03/24/2014 1:44 PM) Caffeine use Coffee. No alcohol use No drug use Tobacco use Current some day smoker.  Family History Erin Phelps([place]; 03/24/2014 1:44 PM) First Degree Relatives No pertinent family history  Pregnancy / Birth History Erin Phelps(Erin [place], [place]; 03/24/2014 1:44 PM) Age at menarche 14 years. Gravida 0 Para 0 Regular periods  Review of Systems Erin Phelps(Erin [place]; 03/24/2014 1:44 PM) General Not Present- Appetite Loss, Chills, Fatigue, Fever, Night Sweats, Weight Gain and Weight Loss. Skin Not Present- Change in Wart/Mole, Dryness, Hives, Jaundice, New Lesions, Non-Healing Wounds, Rash and Ulcer. HEENT Not Present- Earache, Hearing Loss, Hoarseness, Nose Bleed, Oral Ulcers, Ringing in the Ears, Seasonal Allergies, Sinus Pain, Sore Throat, Visual Disturbances, Wears glasses/contact lenses and Yellow Eyes. Respiratory Not Present- Bloody sputum, Chronic Cough, Difficulty Breathing, Snoring and Wheezing. Breast Not Present- Breast Mass, Breast Pain, Nipple Discharge and Skin  Changes. Cardiovascular Not Present- Chest Pain, Difficulty Breathing Lying Down, Leg Cramps, Palpitations, Rapid Heart Rate, Shortness of Breath and Swelling of Extremities. Gastrointestinal Not Present- Abdominal Pain, Bloating, Bloody Stool, Change in Bowel Habits, Chronic diarrhea, Constipation, Difficulty Swallowing, Excessive gas, Gets full quickly at meals, Hemorrhoids, Indigestion, Nausea, Rectal Pain and Vomiting. Female Genitourinary Not Present- Frequency, Nocturia, Painful Urination, Pelvic Pain and Urgency. Musculoskeletal Not Present- Back Pain, Joint Pain, Joint Stiffness, Muscle Pain, Muscle Weakness and Swelling of Extremities. Neurological Not Present- Decreased Memory, Fainting, Headaches, Numbness, Seizures, Tingling, Tremor, Trouble walking and Weakness. Psychiatric Not Present- Anxiety, Bipolar, Change in Sleep Pattern, Depression, Fearful and Frequent crying. Endocrine Not Present- Cold Intolerance, Excessive Hunger, Hair Changes, Heat Intolerance, Hot flashes and New Diabetes. Hematology Not Present- Easy Bruising, Excessive bleeding, Gland problems, HIV and Persistent Infections.   Vitals Erin Phelps(Erin [place] MA; 03/24/2014 1:43 PM) 03/24/2014 1:41 PM Weight: 164.4 lb Height: 64in Body Surface Area: 1.84 m Body Mass Index: 28.22 kg/m Temp.: 97.54F(Temporal)  Pulse: 71 (Regular)  Resp.: 16 (Unlabored)  BP: 138/86 (Sitting, Left Arm, Standard)    Physical Exam Erin Phelps(Erin Mulka MD; 03/24/2014 2:00 PM) General Mental Status-Alert. General Appearance-Consistent with stated age. Hydration-Well hydrated. Voice-Normal.  Head and Neck Head-normocephalic, atraumatic with no lesions or palpable masses. Trachea-midline. Note: 1.5cm cyst    Eye Eyeball - Bilateral-Extraocular movements intact. Sclera/Conjunctiva - Bilateral-No scleral icterus.  Chest and Lung Exam Chest and lung exam reveals -quiet, even and easy respiratory effort with  no use of accessory muscles and on auscultation, normal breath sounds, no adventitious sounds and normal vocal resonance. Inspection Chest Wall - Normal. Back - normal.  Breast Breast - Left-Symmetric, Non Tender, No Biopsy scars, no Dimpling, No Inflammation, No Lumpectomy scars, No Mastectomy scars, No Peau d' Orange. Breast -  Right-Symmetric, Non Tender, No Biopsy scars, no Dimpling, No Inflammation, No Lumpectomy scars, No Mastectomy scars, No Peau d' Orange. Breast Lump-No Palpable Breast Mass.  Cardiovascular Cardiovascular examination reveals -normal heart sounds, regular rate and rhythm with no murmurs and normal pedal pulses bilaterally.  Abdomen Inspection Inspection of the abdomen reveals - No Hernias. Skin - Scar - no surgical scars. Palpation/Percussion Palpation and Percussion of the abdomen reveal - Soft, Non Tender, No Rebound tenderness, No Rigidity (guarding) and No hepatosplenomegaly. Auscultation Auscultation of the abdomen reveals - Bowel sounds normal.  Neurologic Neurologic evaluation reveals -alert and oriented x 3 with no impairment of recent or remote memory. Mental Status-Normal.  Musculoskeletal Normal Exam - Left-Upper Extremity Strength Normal and Lower Extremity Strength Normal. Normal Exam - Right-Upper Extremity Strength Normal and Lower Extremity Strength Normal.  Lymphatic Head & Neck  General Head & Neck Lymphatics: Bilateral - Description - Normal. Axillary  General Axillary Region: Bilateral - Description - Normal. Tenderness - Non Tender. Femoral & Inguinal  Generalized Femoral & Inguinal Lymphatics: Bilateral - Description - Normal. Tenderness - Non Tender.    Assessment & Plan Erin Phelps(Erin Whittingham MD; 03/24/2014 2:03 PM) EPIDERMAL INCLUSION CYST (706.2  L72.0) Impression: 38 year old female with a epidermal inclusion cyst of the right supraclavicular area.  The patient like to proceed to the operating room for excision  of the cyst.  I discussed with the patient the risks benefits of the procedure to include but not limited to: Infection, bleeding, possible recurrence, possible wound infection. The patient voiced understanding and wished to proceed.

## 2014-04-02 NOTE — Transfer of Care (Signed)
Immediate Anesthesia Transfer of Care Note  Patient: Erin BrokerJamie Cammack  Procedure(s) Performed: Procedure(s) (LRB): excision of anterior neck sebaceous cyst (N/A)  Patient Location: PACU  Anesthesia Type: MAC  Level of Consciousness: sedated, patient cooperative and responds to stimulation  Airway & Oxygen Therapy: Patient Spontanous Breathing and Patient connected to face mask oxgen  Post-op Assessment: Report given to PACU RN and Post -op Vital signs reviewed and stable  Post vital signs: Reviewed and stable  Complications: No apparent anesthesia complications

## 2014-04-02 NOTE — Anesthesia Preprocedure Evaluation (Signed)
Anesthesia Evaluation  Patient identified by MRN, date of birth, ID band Patient awake    Reviewed: Allergy & Precautions, H&P , NPO status , Patient's Chart, lab work & pertinent test results  History of Anesthesia Complications (+) history of anesthetic complications (reports history of taking a long time to wake up as a child, no problems since, denies any family history of problems with anesthesia)  Airway Mallampati: II  TM Distance: >3 FB Neck ROM: Full    Dental no notable dental hx. (+) Dental Advisory Given, Teeth Intact   Pulmonary Current Smoker,  breath sounds clear to auscultation  Pulmonary exam normal       Cardiovascular Exercise Tolerance: Good negative cardio ROS  Rhythm:Regular Rate:Normal     Neuro/Psych PSYCHIATRIC DISORDERS Depression negative neurological ROS     GI/Hepatic negative GI ROS, Neg liver ROS,   Endo/Other  negative endocrine ROS  Renal/GU negative Renal ROS  negative genitourinary   Musculoskeletal negative musculoskeletal ROS (+)   Abdominal   Peds negative pediatric ROS (+)  Hematology negative hematology ROS (+)   Anesthesia Other Findings   Reproductive/Obstetrics negative OB ROS                             Anesthesia Physical Anesthesia Plan  ASA: II  Anesthesia Plan: MAC   Post-op Pain Management:    Induction: Intravenous  Airway Management Planned: Nasal Cannula  Additional Equipment:   Intra-op Plan:   Post-operative Plan: Extubation in OR  Informed Consent: I have reviewed the patients History and Physical, chart, labs and discussed the procedure including the risks, benefits and alternatives for the proposed anesthesia with the patient or authorized representative who has indicated his/her understanding and acceptance.   Dental advisory given  Plan Discussed with: CRNA  Anesthesia Plan Comments:         Anesthesia  Quick Evaluation

## 2014-04-02 NOTE — Discharge Instructions (Signed)
Cyst Removal Your caregiver has removed a cyst. A cyst is a sac containing a semi-solid material. Cysts may occur any place on your body. They may remain small for years or gradually get larger. A sebaceous cyst is an enlarged (dilated) sweat gland filled with old sweat (sebum). Unattended, these may become large (the size of a softball) over several years time. These are often removed for improved appearance (cosmetic) reasons or before they become infected to form an abscess. An abscess is an infected cyst. HOME CARE INSTRUCTIONS   Keep your bandage clean and dry. You may change your bandage after 24 hours. If your bandage sticks, use warm water to gently loosen it. Pat the area dry with a clean towel before putting on another bandage.  If possible, keep the area where the cyst was removed raised to relieve soreness, swelling, and promote healing.  If you have stitches, keep them clean and dry.  You may clean your stitches gently with a cotton swab dipped in warm soapy water.  Do not soak the area where the cyst was removed or go swimming. You may shower.  Do not overuse the area where your cyst was removed.  Return in 7 days or as directed to have your stitches removed.  Take medicines as instructed by your caregiver. SEEK IMMEDIATE MEDICAL CARE IF:   An oral temperature above 102 F (38.9 C) develops, not controlled by medication.  Blood continues to soak through the bandage.  You have increasing pain in the area where your cyst was removed.  You have redness, swelling, pus, a bad smell, soreness (inflammation), or red streaks coming away from the stitches. These are signs of infection. MAKE SURE YOU:   Understand these instructions.  Will watch your condition.  Will get help right away if you are not doing well or get worse. Document Released: 05/18/2000 Document Revised: 08/13/2011 Document Reviewed: 09/11/2007 [facility] Patient Information 2015 [place] [place]. This  information is not intended to replace advice given to you by your health care provider. Make sure you discuss any questions you have with your health care provider.     General Anesthesia, Care After Refer to this sheet in the next few weeks. These instructions provide you with information on caring for yourself after your procedure. Your health care provider may also give you more specific instructions. Your treatment has been planned according to current medical practices, but problems sometimes occur. Call your health care provider if you have any problems or questions after your procedure. WHAT TO EXPECT AFTER THE PROCEDURE After the procedure, it is typical to experience:  Sleepiness.  Nausea and vomiting. HOME CARE INSTRUCTIONS  For the first 24 hours after general anesthesia:  Have a responsible person with you.  Do not drive a car. If you are alone, do not take public transportation.  Do not drink alcohol.  Do not take medicine that has not been prescribed by your health care provider.  Do not sign important papers or make important decisions.  You may resume a normal diet and activities as directed by your health care provider.  Change bandages (dressings) as directed.  If you have questions or problems that seem related to general anesthesia, call the hospital and ask for the anesthetist or anesthesiologist on call. SEEK MEDICAL CARE IF:  You have nausea and vomiting that continue the day after anesthesia.  You develop a rash. SEEK IMMEDIATE MEDICAL CARE IF:   You have difficulty breathing.  You have chest pain.  You have any allergic problems. Document Released: 08/27/2000 Document Revised: 05/26/2013 Document Reviewed: 12/04/2012 [facility] Patient Information 2015 [place], [place]. This information is not intended to replace advice given to you by your health care provider. Make sure you discuss any questions you have with your health care provider.

## 2014-04-02 NOTE — Progress Notes (Signed)
Patient was informed of delay due to Dr Derrell Lollingamirez assisting another surgeon. Very anxious and angry. Carollee MassedS Thompson RN, OR staff, to come and speak with patient.  Will talk to anesthesia for comfort measures

## 2014-04-02 NOTE — Progress Notes (Signed)
Patient was informed by OR staff that surgery has been delayed by two hours. She is very upset and tearful. Order for IV fluids and fentanyl obtain for patient comfort. She states she is hungry and has PMS. She states she feels better after IV started.

## 2014-04-02 NOTE — Op Note (Signed)
04/02/2014  4:06 PM  PATIENT:  Erin BrokerJamie Bunyan  38 y.o. female  PRE-OPERATIVE DIAGNOSIS:  sebaceous cyst on neck  POST-OPERATIVE DIAGNOSIS:  sebaceous cyst on neck  PROCEDURE:  Procedure(s): excision of anterior neck sebaceous cyst (N/A)  SURGEON:  Surgeon(s) and Role:    * Axel FillerArmando Satori Krabill, MD - Primary  PHYSICIAN ASSISTANT:   ASSISTANTS: Corky CraftsBrittany Pjetraj, PA-S   ANESTHESIA:   local and IV sedation  EBL:     BLOOD ADMINISTERED:none  DRAINS: none   LOCAL MEDICATIONS USED:  BUPIVICAINE   SPECIMEN:  Source of Specimen:  1.5x0.5 cm sub-q cyst  DISPOSITION OF SPECIMEN:  PATHOLOGY  COUNTS:  YES  TOURNIQUET:  * No tourniquets in log *  DICTATION: .Dragon Dictation The patient was taken back to the operating room and placed in the supine position with bilateral SCDs in place.  The patient was prepped and draped in the usual sterile fashion. After appropriate and biopsy confirmed a timeout was called. All facts were confirmed.   An elliptical incision was made around the area of the subcutaneous cyst. Sharp dissection and electrocautery was used to maintain hemostasis and the cyst was removed in its entirety. There was no breakage of the cyst contents. At this time the area was soaked with Betadine. Hemostasis was achieved using electrocautery. A deep dermal sutures used to reapproximate the deep dermis with a 3-0 Vicryl in interrupted fashion. The skin was reapproximated with interrupted 4-0 Monocryl subcuticular fashion. The skin was dressed with Dermabond. The patient had the procedure well and was taken to the recovery room in stable condition.  PLAN OF CARE: Discharge to home after PACU  PATIENT DISPOSITION:  PACU - hemodynamically stable.   Delay start of Pharmacological VTE agent (>24hrs) due to surgical blood loss or risk of bleeding: not applicable

## 2014-04-05 ENCOUNTER — Encounter (HOSPITAL_COMMUNITY): Payer: Self-pay | Admitting: General Surgery

## 2014-04-08 NOTE — Anesthesia Postprocedure Evaluation (Signed)
  Anesthesia Post-op Note  Patient: Erin Phelps  Procedure(s) Performed: Procedure(s) (LRB): excision of anterior neck sebaceous cyst (N/A)  Patient Location: PACU  Anesthesia Type: General  Level of Consciousness: awake and alert   Airway and Oxygen Therapy: Patient Spontanous Breathing  Post-op Pain: mild  Post-op Assessment: Post-op Vital signs reviewed, Patient's Cardiovascular Status Stable, Respiratory Function Stable, Patent Airway and No signs of Nausea or vomiting  Last Vitals:  Filed Vitals:   04/02/14 1759  BP: 123/90  Pulse: 79  Temp: 37.1 C  Resp: 14    Post-op Vital Signs: stable   Complications: No apparent anesthesia complications

## 2014-08-26 ENCOUNTER — Other Ambulatory Visit (HOSPITAL_COMMUNITY)
Admission: RE | Admit: 2014-08-26 | Discharge: 2014-08-26 | Disposition: A | Discharge status: Home / Self Care | Payer: 59 | Source: Ambulatory Visit | Attending: Nurse Practitioner | Admitting: Nurse Practitioner

## 2014-08-26 ENCOUNTER — Other Ambulatory Visit: Payer: Self-pay | Admitting: Nurse Practitioner

## 2014-08-26 DIAGNOSIS — Z01419 Encounter for gynecological examination (general) (routine) without abnormal findings: Secondary | ICD-10-CM | POA: Diagnosis not present

## 2014-08-26 DIAGNOSIS — Z1151 Encounter for screening for human papillomavirus (HPV): Secondary | ICD-10-CM | POA: Diagnosis present

## 2014-08-31 LAB — CYTOLOGY - PAP

## 2015-01-01 ENCOUNTER — Encounter (HOSPITAL_COMMUNITY): Payer: Self-pay | Admitting: Emergency Medicine

## 2015-01-01 ENCOUNTER — Emergency Department (HOSPITAL_COMMUNITY)
Admission: EM | Admit: 2015-01-01 | Discharge: 2015-01-01 | Disposition: A | Discharge status: Home / Self Care | Payer: 59 | Attending: Emergency Medicine | Admitting: Emergency Medicine

## 2015-01-01 DIAGNOSIS — Z720 Tobacco use: Secondary | ICD-10-CM | POA: Insufficient documentation

## 2015-01-01 DIAGNOSIS — Z91040 Latex allergy status: Secondary | ICD-10-CM | POA: Insufficient documentation

## 2015-01-01 DIAGNOSIS — M542 Cervicalgia: Secondary | ICD-10-CM | POA: Diagnosis present

## 2015-01-01 DIAGNOSIS — M25511 Pain in right shoulder: Secondary | ICD-10-CM | POA: Insufficient documentation

## 2015-01-01 MED ORDER — LORAZEPAM 0.5 MG PO TABS
0.5000 mg | ORAL_TABLET | Freq: Once | ORAL | Status: AC
  Administered 2015-01-01: 0.5 mg via ORAL
  Filled 2015-01-01: qty 1

## 2015-01-01 MED ORDER — LORAZEPAM 1 MG PO TABS: 1.0000 mg | ORAL_TABLET | Freq: Three times a day (TID) | ORAL | Status: AC | PRN

## 2015-01-01 MED ORDER — CYCLOBENZAPRINE HCL 10 MG PO TABS: 10.0000 mg | ORAL_TABLET | Freq: Two times a day (BID) | ORAL | Status: AC | PRN

## 2015-01-01 MED ORDER — CYCLOBENZAPRINE HCL 10 MG PO TABS
5.0000 mg | ORAL_TABLET | Freq: Once | ORAL | Status: AC
  Administered 2015-01-01: 5 mg via ORAL
  Filled 2015-01-01: qty 1

## 2015-01-01 NOTE — Discharge Instructions (Signed)
Please use medication only as directed, do not drink drive or operate will taking the Ativan. Please follow-up with your primary care provider for further evaluation. Orthopedic specialist if symptoms continue to persist. Please return immediately if new or worsening signs or symptoms present.

## 2015-01-01 NOTE — ED Notes (Signed)
Pt. reports posterior neck pain radiatng to shoulders onset this week , denies injury , pain increases with movement , denies fever or chills. No nausea or vomitting .

## 2015-01-01 NOTE — ED Provider Notes (Signed)
CSN: [id number]     Arrival date & time 01/01/15  4098 History   First MD Initiated Contact with Patient 01/01/15 0720     Chief Complaint  Patient presents with  . Neck Pain   HPI   75 stroke female presents today with neck pain. Patient reports weak and have history of left posterior neck and trapezius pain. She reports that she was seen at urgent care and prescribed Robaxin. She reports that this did not improve symptoms, followed up again and was prescribed phenobarbital for suspected migraine. Patient reports that the neck pain started shortly after beginning a new exercise routine where using elliptical. 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 She denies headache, nausea, vomiting, neurological deficits, chest pain, shortness of breath, or any other concerning signs or symptoms. She describes the pain as a "cramping" pain with stabbing pains in the left posterior aspect of her neck. She reports pain is improved with right flexion of her neck and worsened with left lateral flexion. She denies loss of sensation or strength of the extremities. She's been using the phenobarbital with no relief of symptoms. Patient denies significant trauma to the neck, history of infections.  Past Medical History  Diagnosis Date  . Family history of anesthesia complication     pt's mother has hx. of being hard to wake up post-op  . Complication of anesthesia     hx. of being hard to wake up post-op as child  . Borderline hypertension     takes no meds   Past Surgical History  Procedure Laterality Date  . Hernia repair  age 42 mos.    diaphramatic  . Wisdom tooth extraction    . Nasal septoplasty w/ turbinoplasty N/A 02/15/2014    Procedure: NASAL SEPTOPLASTY WITH TURBINATE REDUCTION AND PARTIAL EXCISION MIDDLE TURBINATE ;  Surgeon: Flo Shanks, MD;  Location: [facility];  Service: ENT;  Laterality: N/A;  . Ear cyst excision N/A 04/02/2014    Procedure:  excision of anterior neck sebaceous cyst;  Surgeon: Axel Filler, MD;  Location: [facility];  Service: General;  Laterality: N/A;  . Cyst removal neck  03/2014   Family History  Problem Relation Age of Onset  . Anesthesia problems Mother     hard to wake up post-op   History  Substance Use Topics  . Smoking status: Current Every Day Smoker -- 0.00 packs/day for 0 years    Types: E-cigarettes  . Smokeless tobacco: Never Used  . Alcohol Use: No   OB History    No data available     Review of Systems  All other systems reviewed and are negative.   Allergies  Chlorhexidine; Latex; and Adhesive  Home Medications   Prior to Admission medications   Medication Sig Start Date End Date Taking? Authorizing Provider  acetaminophen (TYLENOL) 500 MG tablet Take 1,000 mg by mouth every 8 (eight) hours as needed for mild pain or headache.     Historical Provider, MD  cyclobenzaprine (FLEXERIL) 10 MG tablet Take 1 tablet (10 mg total) by mouth 2 (two) times daily as needed for muscle spasms. 01/01/15   Eyvonne Mechanic, PA-C  ibuprofen (ADVIL,MOTRIN) 200 MG tablet Take 400 mg by mouth every 6 (six) hours as needed for pain, fever or headache.     Historical Provider, MD  LORazepam (ATIVAN) 1 MG tablet Take 1 tablet (1 mg total) by mouth 3 (three) times daily as needed for anxiety. 01/01/15  Eyvonne Mechanic, PA-C  Menthol (HALLS COUGH DROPS MT) Use as directed 1 each in the mouth or throat as needed (sore throat.).    Historical Provider, MD  oxyCODONE-acetaminophen (ROXICET) 5-325 MG per tablet Take 1-2 tablets by mouth every 4 (four) hours as needed for severe pain. 04/02/14   Axel Filler, MD  sodium chloride (OCEAN) 0.65 % SOLN nasal spray Place 2 sprays into both nostrils daily as needed for congestion.    Historical Provider, MD   BP 121/80 mmHg  Pulse 96  Temp(Src) 97.7 F (36.5 C) (Oral)  Resp 20  Ht 5' 4.5" (1.638 m)  Wt 155 lb (70.308 kg)  BMI 26.20 kg/m2  SpO2 98%  LMP  12/20/2014 (Approximate)   Physical Exam  Constitutional: She is oriented to person, place, and time. She appears well-developed and well-nourished.  HENT:  Head: Normocephalic and atraumatic.  Eyes: Conjunctivae are normal. Pupils are equal, round, and reactive to light. Right eye exhibits no discharge. Left eye exhibits no discharge. No scleral icterus.  Neck: Normal range of motion. No JVD present. No tracheal deviation present.  Pulmonary/Chest: Effort normal. No stridor.  Musculoskeletal:  Patient with her neck flexed to the right with elevation of the right shoulder and initial inspection. Patient has full active range of motion reports left posterior shoulder and cervical pain with straightening of the neck. She has tenderness to the trapezius. No signs of trauma, infection, deformities step-off's. She has no CT spine tenderness. She has 2+ radial pulses bilaterally in all range of motion of the shoulders, negative compression negative lateral compression of the cervical spine. Strength 5 out of 5, sensation grossly intact, cap refill less than 3 seconds in both extremities.  Neurological: She is alert and oriented to person, place, and time. Coordination normal.  Skin: Skin is warm and dry.  Psychiatric: She has a normal mood and affect. Her behavior is normal. Judgment and thought content normal.  Nursing note and vitals reviewed.   ED Course  Procedures (including critical care time) Labs Review Labs Reviewed - No data to display  Imaging Review No results found.   EKG Interpretation None      MDM   Final diagnoses:  Neck pain    Labs:   Imaging:  Consults:  Therapeutics:  Discharge Meds: Ativan, Flexeril  Assessment/Plan: Patient presents with likely muscular cramps and neck pain. Patient denies headache, signs of infection, neurological deficits, or any other concerning finding today. No concern for thoracic outlet, or significant neuro impingement. She is  tender to palpation of the trapezius and left lateral musculature. She'll be switched from Robaxin to Flexeril, given a short course of Ativan, and encouraged to follow up with her primary care provider for further evaluation and management. She was also given orthopedic referral information. Patient was instructed to use heat and massage, ibuprofen, and prescribed medications. She is given strict return precautions, she verbalized her understanding and agreement to today's plan and had no further questions concerns at the time of discharge.         Eyvonne Mechanic, PA-C 01/01/15 9604  Elwin Mocha, MD 01/01/15 [phone]

## 2015-02-13 ENCOUNTER — Emergency Department (INDEPENDENT_AMBULATORY_CARE_PROVIDER_SITE_OTHER)
Admission: EM | Admit: 2015-02-13 | Discharge: 2015-02-13 | Disposition: A | Discharge status: Home / Self Care | Payer: 59 | Source: Home / Self Care | Attending: Emergency Medicine | Admitting: Emergency Medicine

## 2015-02-13 ENCOUNTER — Encounter (HOSPITAL_COMMUNITY): Payer: Self-pay | Admitting: Emergency Medicine

## 2015-02-13 DIAGNOSIS — R51 Headache: Secondary | ICD-10-CM | POA: Diagnosis not present

## 2015-02-13 DIAGNOSIS — J0140 Acute pansinusitis, unspecified: Secondary | ICD-10-CM | POA: Diagnosis not present

## 2015-02-13 DIAGNOSIS — R519 Headache, unspecified: Secondary | ICD-10-CM

## 2015-02-13 MED ORDER — KETOROLAC TROMETHAMINE 60 MG/2ML IM SOLN
INTRAMUSCULAR | Status: AC
  Filled 2015-02-13: qty 2

## 2015-02-13 MED ORDER — KETOROLAC TROMETHAMINE 60 MG/2ML IM SOLN
60.0000 mg | Freq: Once | INTRAMUSCULAR | Status: AC
  Administered 2015-02-13: 60 mg via INTRAMUSCULAR

## 2015-02-13 MED ORDER — CEFDINIR 300 MG PO CAPS: 300.0000 mg | ORAL_CAPSULE | Freq: Two times a day (BID) | ORAL | Status: AC

## 2015-02-13 MED ORDER — CETIRIZINE HCL 10 MG PO TABS: 10.0000 mg | ORAL_TABLET | Freq: Every day | ORAL | Status: AC

## 2015-02-13 MED ORDER — PREDNISONE 50 MG PO TABS: ORAL_TABLET | ORAL | Status: AC

## 2015-02-13 MED ORDER — FLUTICASONE PROPIONATE 50 MCG/ACT NA SUSP: 1.0000 | Freq: Every day | NASAL | Status: AC

## 2015-02-13 NOTE — ED Notes (Signed)
Pt c/o persistent bilateral ear pain and HA onset 6 days PCP placed her on Augmentin 875 mg w/no relief Also gave her Almotriptan 125 mg that has made her feel "weird" Sx today include facial pressure, weakness, fatigue.... HR = 120 Alert and oriented x4... No acute distress.

## 2015-02-13 NOTE — ED Provider Notes (Signed)
CSN: [id number]     Arrival date & time 02/13/15  1738 History   First MD Initiated Contact with Patient 02/13/15 1824     Chief Complaint  Patient presents with  . Otalgia  . Headache   (Consider location/radiation/quality/duration/timing/severity/associated sxs/prior Treatment) HPI  She is a 39 year old woman here for evaluation of headache, sinus pressure, and ear pain. Her symptoms started about a week ago. She reports some mild nasal symptoms, but a lot of sinus pressure. She also reports a headache that got acutely worse yesterday. Both of her ears hurt, right worse than left. She reports a mild sore throat. No fevers, chills, nausea, vomiting. No cough or shortness of breath. She saw her primary care doctor on Tuesday and was prescribed Augmentin, but this has not helped. She reports a history of sinus issues for which she had sinus surgery a year ago. She states she has not always been good at finishing her antibiotics.  Past Medical History  Diagnosis Date  . Family history of anesthesia complication     pt's mother has hx. of being hard to wake up post-op  . Complication of anesthesia     hx. of being hard to wake up post-op as child  . Borderline hypertension     takes no meds   Past Surgical History  Procedure Laterality Date  . Hernia repair  age 68 mos.    diaphramatic  . Wisdom tooth extraction    . Nasal septoplasty w/ turbinoplasty N/A 02/15/2014    Procedure: NASAL SEPTOPLASTY WITH TURBINATE REDUCTION AND PARTIAL EXCISION MIDDLE TURBINATE ;  Surgeon: Flo Shanks, MD;  Location: [facility];  Service: ENT;  Laterality: N/A;  . Ear cyst excision N/A 04/02/2014    Procedure: excision of anterior neck sebaceous cyst;  Surgeon: Axel Filler, MD;  Location: [facility];  Service: General;  Laterality: N/A;  . Cyst removal neck  03/2014   Family History  Problem Relation Age of Onset  . Anesthesia problems Mother     hard to wake up post-op   Social  History  Substance Use Topics  . Smoking status: Current Every Day Smoker -- 0.00 packs/day for 0 years    Types: E-cigarettes  . Smokeless tobacco: Never Used  . Alcohol Use: No   OB History    No data available     Review of Systems As in history of present illness Allergies  Chlorhexidine; Latex; and Adhesive  Home Medications   Prior to Admission medications   Medication Sig Start Date End Date Taking? Authorizing Provider  almotriptan (AXERT) 12.5 MG tablet Take 12.5 mg by mouth as needed for migraine. may repeat in 2 hours if needed   Yes Historical Provider, MD  acetaminophen (TYLENOL) 500 MG tablet Take 1,000 mg by mouth every 8 (eight) hours as needed for mild pain or headache.     Historical Provider, MD  cefdinir (OMNICEF) 300 MG capsule Take 1 capsule (300 mg total) by mouth 2 (two) times daily. 02/13/15   Charm Rings, MD  cetirizine (ZYRTEC) 10 MG tablet Take 1 tablet (10 mg total) by mouth daily. 02/13/15   Charm Rings, MD  cyclobenzaprine (FLEXERIL) 10 MG tablet Take 1 tablet (10 mg total) by mouth 2 (two) times daily as needed for muscle spasms. 01/01/15   Eyvonne Mechanic, PA-C  fluticasone (FLONASE) 50 MCG/ACT nasal spray Place 1 spray into both nostrils daily. 02/13/15   Charm Rings, MD  ibuprofen (ADVIL,MOTRIN) 200 MG  tablet Take 400 mg by mouth every 6 (six) hours as needed for pain, fever or headache.     Historical Provider, MD  LORazepam (ATIVAN) 1 MG tablet Take 1 tablet (1 mg total) by mouth 3 (three) times daily as needed for anxiety. 01/01/15   Eyvonne Mechanic, PA-C  Menthol (HALLS COUGH DROPS MT) Use as directed 1 each in the mouth or throat as needed (sore throat.).    Historical Provider, MD  oxyCODONE-acetaminophen (ROXICET) 5-325 MG per tablet Take 1-2 tablets by mouth every 4 (four) hours as needed for severe pain. 04/02/14   Axel Filler, MD  predniSONE (DELTASONE) 50 MG tablet Take 1 pill daily for 5 days. 02/13/15   Charm Rings, MD  sodium chloride  (OCEAN) 0.65 % SOLN nasal spray Place 2 sprays into both nostrils daily as needed for congestion.    Historical Provider, MD   Meds Ordered and Administered this Visit   Medications  ketorolac (TORADOL) injection 60 mg (not administered)    BP 119/88 mmHg  Pulse 120  Temp(Src) 98.4 F (36.9 C) (Oral)  Resp 120  SpO2 100%  LMP 01/31/2015 No data found.   Physical Exam  Constitutional: She is oriented to person, place, and time. She appears well-developed and well-nourished. No distress.  HENT:  Mouth/Throat: No oropharyngeal exudate.  Bilateral maxillary sinus tenderness. Mild nasal discharge seen. Bilateral TMs retracted, there is a clear effusion in the left.  Eyes: Conjunctivae are normal.  Neck: Neck supple.  Cardiovascular: Regular rhythm and normal heart sounds.   No murmur heard. Mildly tachycardic, rate around 100.  Pulmonary/Chest: Effort normal and breath sounds normal. No respiratory distress. She has no wheezes. She has no rales.  Lymphadenopathy:    She has no cervical adenopathy.  Neurological: She is alert and oriented to person, place, and time.    ED Course  Procedures (including critical care time)  Labs Review Labs Reviewed - No data to display  Imaging Review No results found.   MDM   1. Acute pansinusitis, recurrence not specified   2. Acute nonintractable headache, unspecified headache type    Toradol given here for headache.  We'll change antibiotics to [facility]. Recommended 5 days of prednisone. Also recommended daily Zyrtec and Flonase. Frequent use of nasal saline spray. Follow-up as needed.    Charm Rings, MD 02/13/15 [phone]

## 2015-02-13 NOTE — Discharge Instructions (Signed)
I think all your symptoms are coming from a sinus infection. Please stop the Augmentin and start Omnicef. Take prednisone daily for the next 5 days. Start Zyrtec and Flonase. Use your nasal saline spray as often as you can. You should see improvement in the next 2-3 days. Follow-up as needed.

## 2015-02-15 ENCOUNTER — Encounter (HOSPITAL_COMMUNITY): Payer: Self-pay | Admitting: Emergency Medicine

## 2015-02-15 ENCOUNTER — Emergency Department (HOSPITAL_COMMUNITY): Payer: 59

## 2015-02-15 ENCOUNTER — Emergency Department (HOSPITAL_COMMUNITY)
Admission: EM | Admit: 2015-02-15 | Discharge: 2015-02-15 | Disposition: A | Discharge status: Home / Self Care | Payer: 59 | Attending: Emergency Medicine | Admitting: Emergency Medicine

## 2015-02-15 DIAGNOSIS — R51 Headache: Secondary | ICD-10-CM | POA: Diagnosis present

## 2015-02-15 DIAGNOSIS — R079 Chest pain, unspecified: Secondary | ICD-10-CM | POA: Insufficient documentation

## 2015-02-15 DIAGNOSIS — Z720 Tobacco use: Secondary | ICD-10-CM | POA: Insufficient documentation

## 2015-02-15 DIAGNOSIS — Z7951 Long term (current) use of inhaled steroids: Secondary | ICD-10-CM | POA: Insufficient documentation

## 2015-02-15 DIAGNOSIS — R000 Tachycardia, unspecified: Secondary | ICD-10-CM | POA: Diagnosis not present

## 2015-02-15 DIAGNOSIS — J0110 Acute frontal sinusitis, unspecified: Secondary | ICD-10-CM | POA: Diagnosis not present

## 2015-02-15 DIAGNOSIS — Z91040 Latex allergy status: Secondary | ICD-10-CM | POA: Insufficient documentation

## 2015-02-15 DIAGNOSIS — Z79899 Other long term (current) drug therapy: Secondary | ICD-10-CM | POA: Insufficient documentation

## 2015-02-15 LAB — CBC
HCT: 32.6 % — ABNORMAL LOW (ref 36.0–46.0)
Hemoglobin: 11.9 g/dL — ABNORMAL LOW (ref 12.0–15.0)
MCH: 30.8 pg (ref 26.0–34.0)
MCHC: 36.5 g/dL — ABNORMAL HIGH (ref 30.0–36.0)
MCV: 84.5 fL (ref 78.0–100.0)
Platelets: 165 10*3/uL (ref 150–400)
RBC: 3.86 MIL/uL — ABNORMAL LOW (ref 3.87–5.11)
RDW: 12.7 % (ref 11.5–15.5)
WBC: 12.5 10*3/uL — ABNORMAL HIGH (ref 4.0–10.5)

## 2015-02-15 LAB — I-STAT TROPONIN, ED: Troponin i, poc: 0 ng/mL (ref 0.00–0.08)

## 2015-02-15 LAB — BASIC METABOLIC PANEL
Anion gap: 8 (ref 5–15)
BUN: 13 mg/dL (ref 6–20)
CO2: 22 mmol/L (ref 22–32)
Calcium: 9.3 mg/dL (ref 8.9–10.3)
Chloride: 106 mmol/L (ref 101–111)
Creatinine, Ser: 0.8 mg/dL (ref 0.44–1.00)
GFR calc Af Amer: >60 mL/min (ref >60)
GFR calc non Af Amer: >60 mL/min (ref >60)
Glucose, Bld: 161 mg/dL — ABNORMAL HIGH (ref 65–99)
Potassium: 3.8 mmol/L (ref 3.5–5.1)
Sodium: 136 mmol/L (ref 135–145)

## 2015-02-15 MED ORDER — KETOROLAC TROMETHAMINE 30 MG/ML IJ SOLN
30.0000 mg | Freq: Once | INTRAMUSCULAR | Status: AC
  Administered 2015-02-15: 30 mg via INTRAMUSCULAR
  Filled 2015-02-15: qty 1

## 2015-02-15 MED ORDER — METOCLOPRAMIDE HCL 5 MG PO TABS
5.0000 mg | ORAL_TABLET | Freq: Once | ORAL | Status: AC
  Administered 2015-02-15: 5 mg via ORAL
  Filled 2015-02-15: qty 1

## 2015-02-15 MED ORDER — DIPHENHYDRAMINE HCL 25 MG PO CAPS
25.0000 mg | ORAL_CAPSULE | Freq: Once | ORAL | Status: AC
  Administered 2015-02-15: 25 mg via ORAL
  Filled 2015-02-15: qty 1

## 2015-02-15 NOTE — ED Provider Notes (Signed)
CSN: [id number]     Arrival date & time 02/15/15  1400 History  This chart was scribed for Erin Mechanic, PA-C, working with Erin Razor, MD by Elon Spanner, ED Scribe. This patient was seen in room TR10C/TR10C and the patient's care was started at 5:35 PM.   Chief Complaint  Patient presents with  . Headache  . Tachycardia  . Generalized Body Aches  . Chest Pain   The history is provided by the patient. No language interpreter was used.   HPI Comments: Erin Phelps is a 39 y.o. female with hx of HTN who presents to the Emergency Department complaining of a constant, unchanged frontal headache and resolved bilateral ear pain onset 1 week ago.  The patient was seen by her PCP at onset and prescribed Augmentin on suspicion of ear infection.  Patient took the Augmentin for 5 days but did not finish the course because she was observing no improvement (last use 2 days ago).  She was seen yesterday by [facility] where she was prescribed omnicef and prednisone on suspicion of sinus infection.  She has taken both medications for 1 day but stopped the prednisone this morning after she experiencing some heart-racing after climbing the stairs and was verbally aggressive with her coworkers.  She has also had Toradol, Excedrin migraine, and intermittent ibuprofen without relief.  Patient has a hx of septoplasty and turbinate reduction by Dr. Lazarus Salines .  She reports this episode feels similar to the headaches she have prior to those surgeries.  She denies rhinorrhea, nasal discharge, abdominal pain, cough, fever, back pain.     Past Medical History  Diagnosis Date  . Family history of anesthesia complication     pt's mother has hx. of being hard to wake up post-op  . Complication of anesthesia     hx. of being hard to wake up post-op as child  . Borderline hypertension     takes no meds   Past Surgical History  Procedure Laterality Date  . Hernia repair  age 54 mos.    diaphramatic  . Wisdom  tooth extraction    . Nasal septoplasty w/ turbinoplasty N/A 02/15/2014    Procedure: NASAL SEPTOPLASTY WITH TURBINATE REDUCTION AND PARTIAL EXCISION MIDDLE TURBINATE ;  Surgeon: Flo Shanks, MD;  Location: [facility];  Service: ENT;  Laterality: N/A;  . Ear cyst excision N/A 04/02/2014    Procedure: excision of anterior neck sebaceous cyst;  Surgeon: Axel Filler, MD;  Location: [facility];  Service: General;  Laterality: N/A;  . Cyst removal neck  03/2014   Family History  Problem Relation Age of Onset  . Anesthesia problems Mother     hard to wake up post-op   Social History  Substance Use Topics  . Smoking status: Current Every Day Smoker -- 0.00 packs/day for 0 years    Types: E-cigarettes  . Smokeless tobacco: Never Used  . Alcohol Use: No   OB History    No data available     Review of Systems  All other systems reviewed and are negative.   Allergies  Axert; Chlorhexidine; Latex; and Adhesive  Home Medications   Prior to Admission medications   Medication Sig Start Date End Date Taking? Authorizing Provider  acetaminophen (TYLENOL) 500 MG tablet Take 1,000 mg by mouth every 8 (eight) hours as needed for mild pain or headache.    Yes Historical Provider, MD  cefdinir (OMNICEF) 300 MG capsule Take 1 capsule (300 mg total)  by mouth 2 (two) times daily. 02/13/15  Yes Charm Rings, MD  cetirizine (ZYRTEC) 10 MG tablet Take 1 tablet (10 mg total) by mouth daily. 02/13/15  Yes Charm Rings, MD  fluticasone (FLONASE) 50 MCG/ACT nasal spray Place 1 spray into both nostrils daily. 02/13/15  Yes Charm Rings, MD  ibuprofen (ADVIL,MOTRIN) 200 MG tablet Take 400 mg by mouth every 6 (six) hours as needed for pain, fever or headache.    Yes Historical Provider, MD  methyldopa (ALDOMET) 500 MG tablet Take 500 mg by mouth 2 (two) times daily. 01/21/15  Yes Historical Provider, MD  sodium chloride (OCEAN) 0.65 % SOLN nasal spray Place 2 sprays into both nostrils daily as  needed for congestion.   Yes Historical Provider, MD  cyclobenzaprine (FLEXERIL) 10 MG tablet Take 1 tablet (10 mg total) by mouth 2 (two) times daily as needed for muscle spasms. Patient not taking: Reported on 02/15/2015 01/01/15   Erin Mechanic, PA-C  LORazepam (ATIVAN) 1 MG tablet Take 1 tablet (1 mg total) by mouth 3 (three) times daily as needed for anxiety. Patient not taking: Reported on 02/15/2015 01/01/15   Erin Mechanic, PA-C  oxyCODONE-acetaminophen (ROXICET) 5-325 MG per tablet Take 1-2 tablets by mouth every 4 (four) hours as needed for severe pain. Patient not taking: Reported on 02/15/2015 04/02/14   Axel Filler, MD  predniSONE (DELTASONE) 50 MG tablet Take 1 pill daily for 5 days. Patient not taking: Reported on 02/15/2015 02/13/15   Charm Rings, MD   BP 119/80 mmHg  Pulse 89  Temp(Src) 97.8 F (36.6 C) (Oral)  Resp 16  Ht  (1.626 m)  Wt 160 lb (72.576 kg)  BMI 27.45 kg/m2  SpO2 100%  LMP 01/31/2015   Physical Exam  Constitutional: She is oriented to person, place, and time. She appears well-developed and well-nourished. No distress.  HENT:  Head: Normocephalic and atraumatic.  Right Ear: Hearing, tympanic membrane, external ear and ear canal normal.  Left Ear: Hearing, tympanic membrane, external ear and ear canal normal.  Mouth/Throat: Uvula is midline, oropharynx is clear and moist and mucous membranes are normal. No oropharyngeal exudate, posterior oropharyngeal edema, posterior oropharyngeal erythema or tonsillar abscesses.  Tenderness to percussion of the maxillary and frontal sinuses, no signs of soft tissue swelling or edema.Turbinates inflamed, patent.   Eyes: Conjunctivae and EOM are normal. Pupils are equal, round, and reactive to light.  Neck: Normal range of motion. Neck supple. No tracheal deviation present.  Cardiovascular: Normal rate, regular rhythm and normal heart sounds.  Exam reveals no friction rub.   No murmur heard. Pulmonary/Chest:  Effort normal and breath sounds normal. No respiratory distress. She has no wheezes. She has no rales. She exhibits no tenderness.  Musculoskeletal: Normal range of motion.  Neurological: She is alert and oriented to person, place, and time. No cranial nerve deficit. She exhibits normal muscle tone. Coordination normal.  Skin: Skin is warm and dry.  Psychiatric: She has a normal mood and affect. Her behavior is normal.  Nursing note and vitals reviewed.   ED Course  Procedures (including critical care time) Labs Review Labs Reviewed  BASIC METABOLIC PANEL - Abnormal; Notable for the following:    Glucose, Bld 161 (*)    All other components within normal limits  CBC - Abnormal; Notable for the following:    WBC 12.5 (*)    RBC 3.86 (*)    Hemoglobin 11.9 (*)    HCT 32.6 (*)  MCHC 36.5 (*)    All other components within normal limits  I-STAT TROPOININ, ED    Imaging Review No results found. I have personally reviewed and evaluated these images and lab results as part of my medical decision-making.   EKG Interpretation   Date/Time:  Tuesday February 15 2015 14:31:00 EDT Ventricular Rate:  112 PR Interval:  126 QRS Duration: 92 QT Interval:  350 QTC Calculation: 477 R Axis:   20 Text Interpretation:  Sinus tachycardia Otherwise normal ECG ED PHYSICIAN  INTERPRETATION AVAILABLE IN [facility] Confirmed by TEST, Record  ([id number]) on 02/16/2015 8:03:00 AM      MDM   Final diagnoses:  Acute frontal sinusitis, recurrence not specified    Labs:  Imaging:   Therapeutics: Toradol 30 mg, Benadryl 25 mg, Reglan 5 mg.  Assessment/Plan: patient presents with likely sinusitis with associated headache. She reports this is identical to previous sinus headaches that she experienced before her sinus surgeries. Patient has been seen by numerous providers for this, with the most recent changed to [facility] and prednisone, she is only completed one day of therapy prior to my  evaluation. I agree with previous providers assessment of sinusitis, patient is instructed to continue using Omnicef. Patient does have a significant sinus history, uncertain why she has not followed up with her ENT specialist, she is encouraged to contact ENT and schedule follow-up evaluation soon as possible for further evaluation and management. Patient has noconcerning signs or symptoms on exam today would necessitate further evaluation or management here in the ED setting. Patient is given strict return precautions, she verbalizes understanding and agreement for today's plan had no further questions or concerns at the time of discharge  Discharge Meds:  I personally performed the services described in this documentation, which was scribed in my presence. The recorded information has been reviewed and is accurate.    Erin Mechanic, PA-C 02/18/15 1228  Erin Razor, MD 02/23/15 [phone]

## 2015-02-15 NOTE — ED Notes (Signed)
Pt here with ear pain that has given her a headache 7/10, hx of sinus surgery, has been seen by private dr and urgent care in past few days. Headache - started as a sinus headache, tension headache, was put on prednisone Yesterday.

## 2015-02-15 NOTE — Discharge Instructions (Signed)
Sinusitis Sinusitis is redness, soreness, and puffiness (inflammation) of the air pockets in the bones of your face (sinuses). The redness, soreness, and puffiness can cause air and mucus to get trapped in your sinuses. This can allow germs to grow and cause an infection.  HOME CARE   Drink enough fluids to keep your pee (urine) clear or pale yellow.  Use a humidifier in your home.  Run a hot shower to create steam in the bathroom. Sit in the bathroom with the door closed. Breathe in the steam 3-4 times a day.  Put a warm, moist washcloth on your face 3-4 times a day, or as told by your doctor.  Use salt water sprays (saline sprays) to wet the thick fluid in your nose. This can help the sinuses drain.  Only take medicine as told by your doctor. GET HELP RIGHT AWAY IF:   Your pain gets worse.  You have very bad headaches.  You are sick to your stomach (nauseous).  You throw up (vomit).  You are very sleepy (drowsy) all the time.  Your face is puffy (swollen).  Your vision changes.  You have a stiff neck.  You have trouble breathing. MAKE SURE YOU:   Understand these instructions.  Will watch your condition.  Will get help right away if you are not doing well or get worse. Document Released: 11/07/2007 Document Revised: 02/13/2012 Document Reviewed: 12/25/2011 [facility] Patient Information 2015 [place], [place]. This information is not intended to replace advice given to you by your health care provider. Make sure you discuss any questions you have with your health care provider.  Please follow-up with your ENT specialist for further evaluation and management. Please continue using antibiotics prescribed to, monitor for new or worsening signs or symptoms, follow-up immediately if any present.

## 2015-07-08 ENCOUNTER — Other Ambulatory Visit: Payer: Self-pay | Admitting: Internal Medicine

## 2015-07-08 DIAGNOSIS — M542 Cervicalgia: Secondary | ICD-10-CM

## 2015-07-19 ENCOUNTER — Ambulatory Visit
Admission: RE | Admit: 2015-07-19 | Discharge: 2015-07-19 | Disposition: A | Discharge status: Home / Self Care | Payer: 59 | Source: Ambulatory Visit | Attending: Internal Medicine | Admitting: Internal Medicine

## 2015-07-19 DIAGNOSIS — M542 Cervicalgia: Secondary | ICD-10-CM

## 2017-05-22 IMAGING — MR MR CERVICAL SPINE W/O CM
4 of 5 series · 28 of 48 positions shown · non-contrast
Comparison: None.

CLINICAL DATA: Left-sided neck pain with back and shoulder pain and
headaches for 8 months. Cervicalgia.

EXAM:
MRI CERVICAL SPINE WITHOUT CONTRAST
TECHNIQUE: Multiplanar, multisequence MR imaging of the cervical spine was
performed. No intravenous contrast was administered.

[Series 3: T2 · sagittal · 3.0mm · 0.66mm/px · 6 of 13 slices shown (1 of 2)]
[im 1/13]
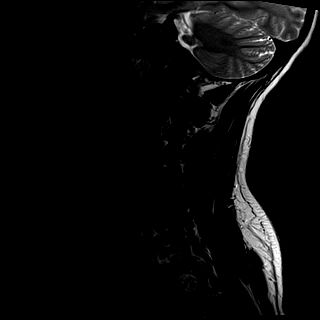
[im 3/13]
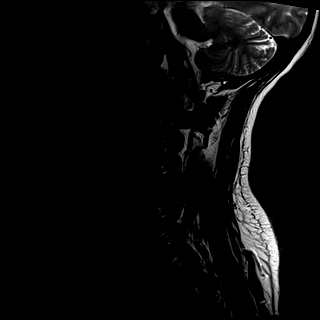
[im 5/13]
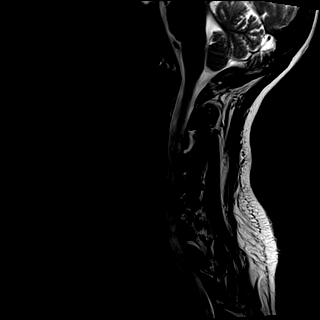
[im 8/13]
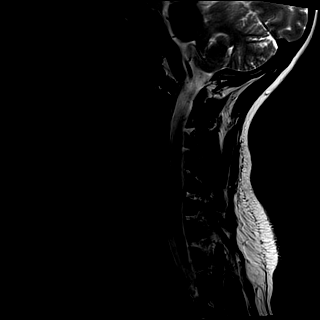
[im 10/13]
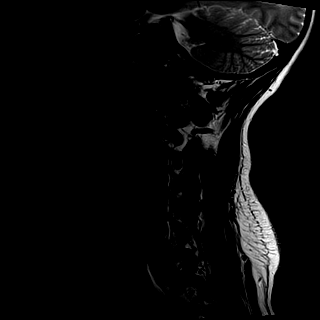
[im 13/13]
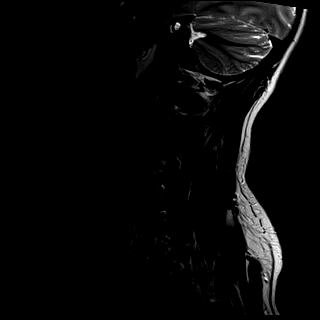

[Series 4: T1 · sagittal · 3.0mm · 0.41mm/px · 7 of 13 slices shown]
[im 1/13]
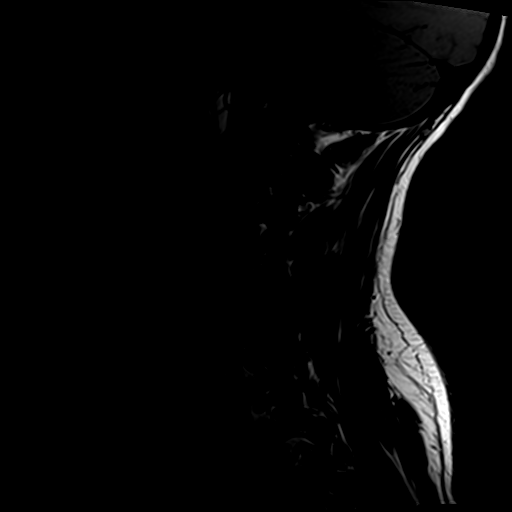
[im 3/13]
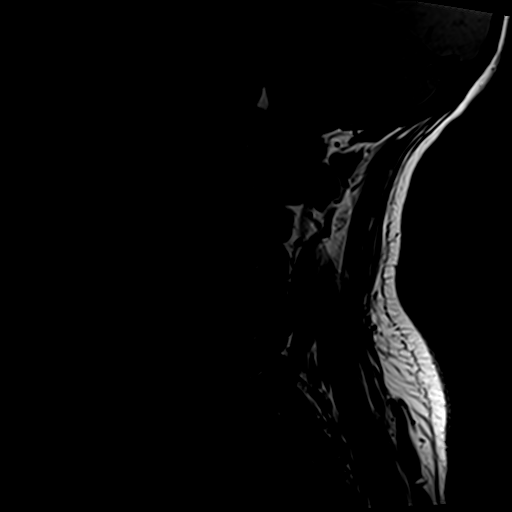
[im 5/13]
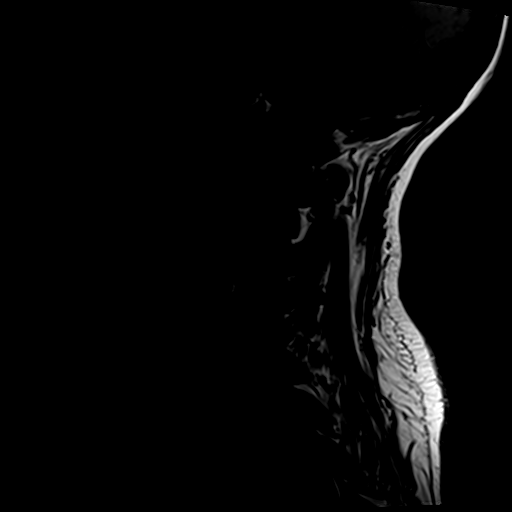
[im 7/13]
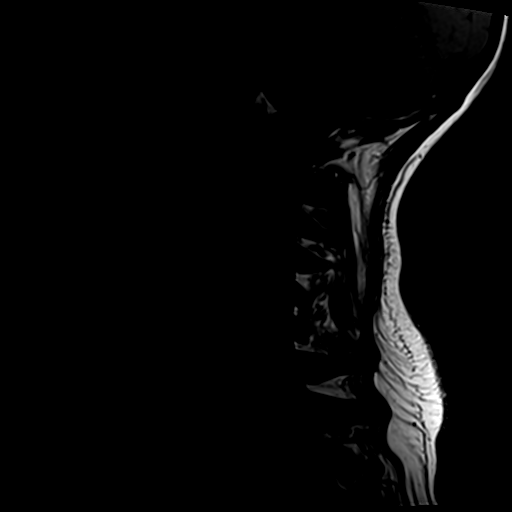
[im 9/13]
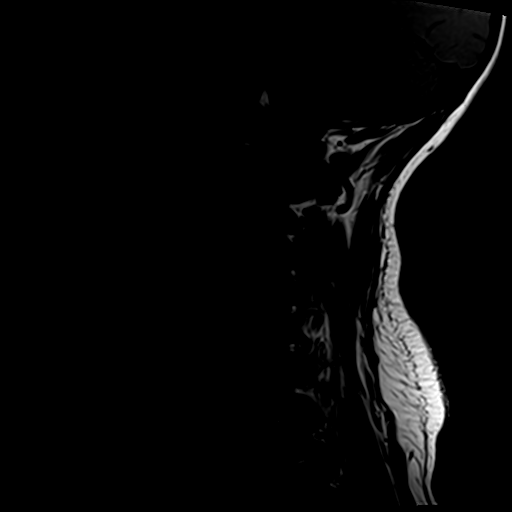
[im 11/13]
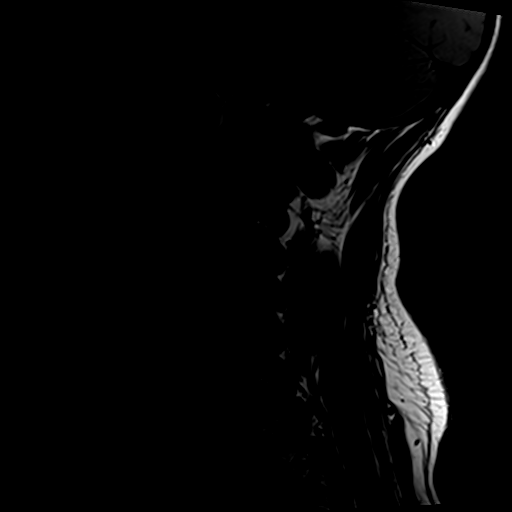
[im 13/13]
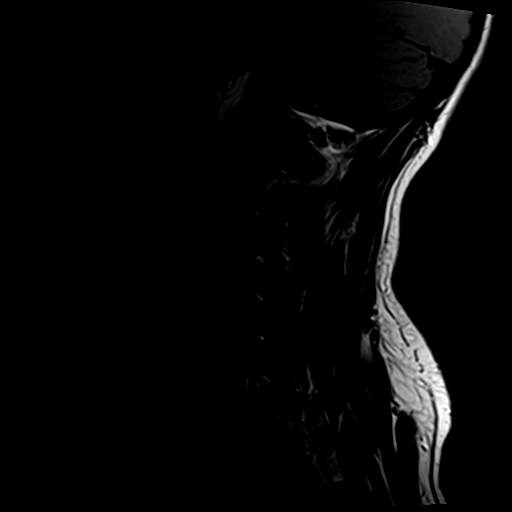

[Series 5: tir sag · sagittal · 3.0mm · 0.41mm/px · 7 of 13 slices shown]
[im 1/13]
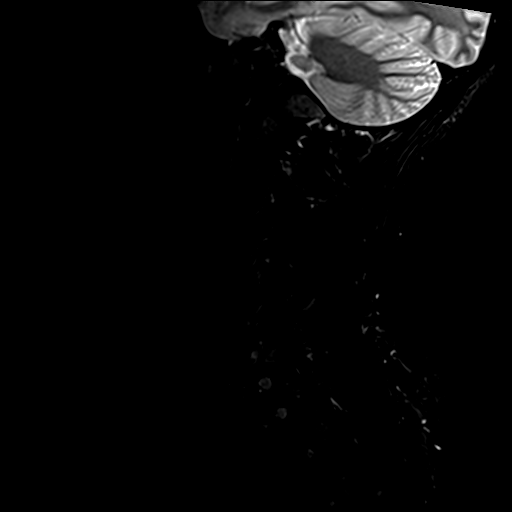
[im 3/13]
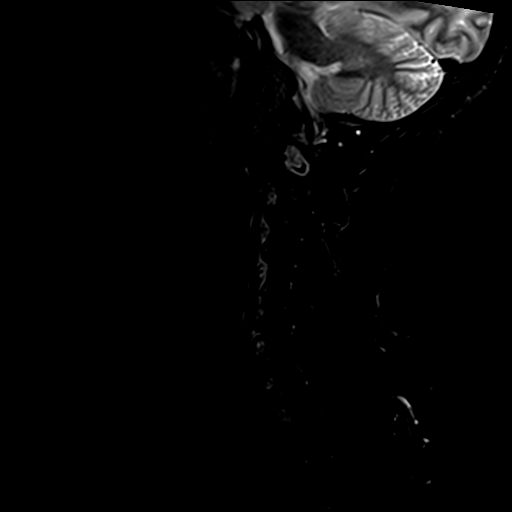
[im 5/13]
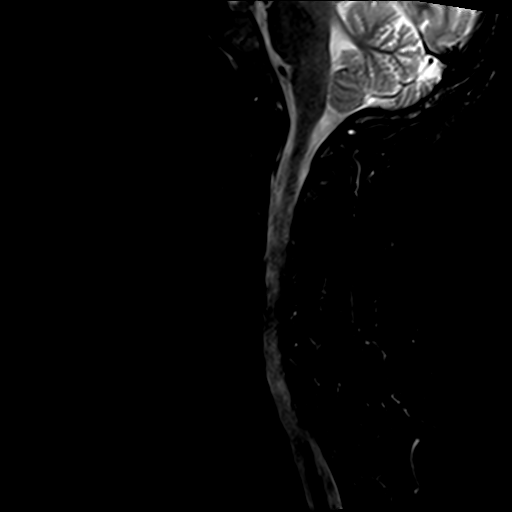
[im 7/13]
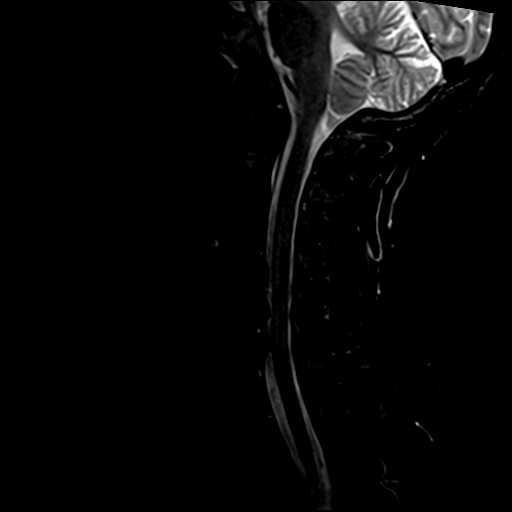
[im 9/13]
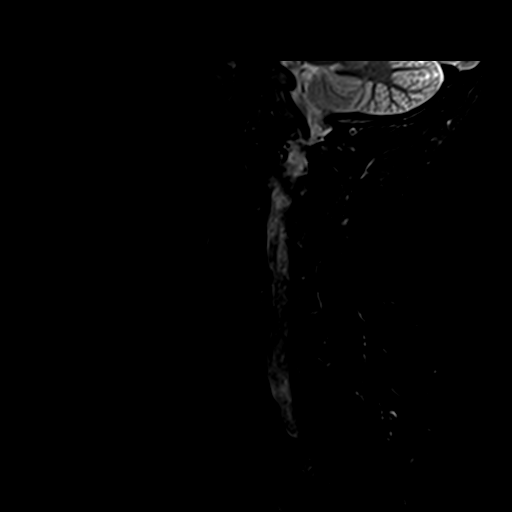
[im 11/13]
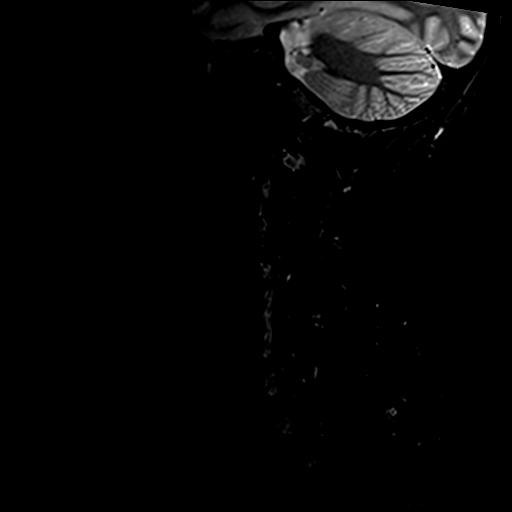
[im 13/13]
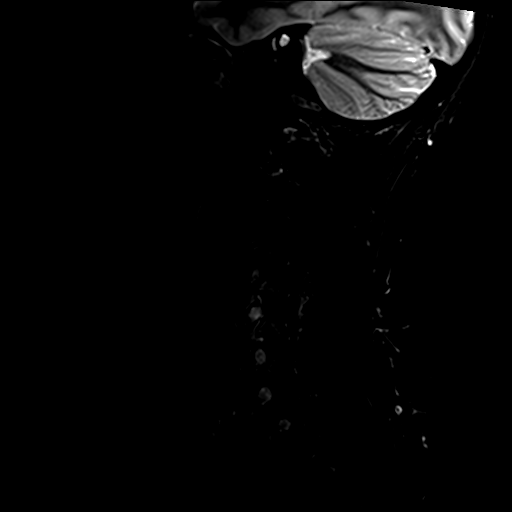

[Series 7: T2 · axial · 3.0mm · 0.70mm/px · z∈[-65,+34]mm · 8 of 28 slices shown (2 of 2)]
[im 1/28]
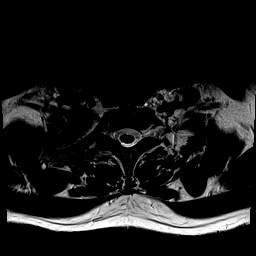
[im 5/28]
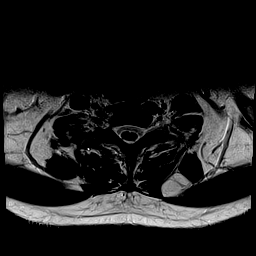
[im 9/28]
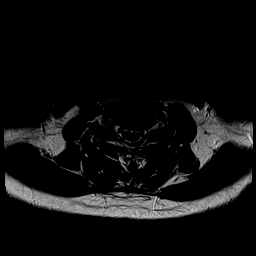
[im 13/28]
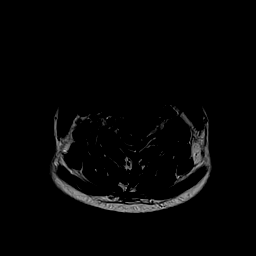
[im 15/28]
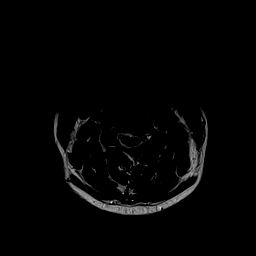
[im 19/28]
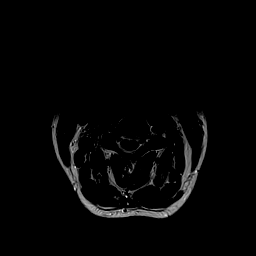
[im 23/28]
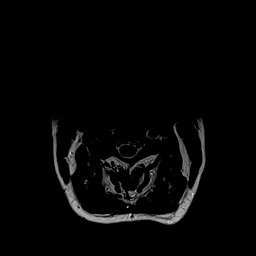
[im 28/28]
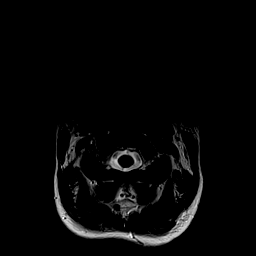

[28 of 48 positions shown; findings below may reference images not displayed]

FINDINGS: The visualized intracranial contents, cervical spinal cord and
paraspinal soft tissues are normal.

There is no facet arthritis in the cervical spine.

Craniocervical junction through C4-5:  Normal.

C5-6: Small central disc bulge minimally asymmetric to the left with
no neural impingement. Widely patent neural foramina.

C6-7 through T2-3:  Normal.
IMPRESSION: Minimal degenerative disc disease at C5-6. Otherwise, normal MRI of
the cervical spine.

## 2019-09-11 ENCOUNTER — Ambulatory Visit: Payer: Self-pay

## 2019-09-11 ENCOUNTER — Ambulatory Visit: Payer: 59 | Attending: Internal Medicine

## 2019-09-11 DIAGNOSIS — Z23 Encounter for immunization: Secondary | ICD-10-CM

## 2019-09-11 NOTE — Progress Notes (Signed)
   Covid-19 Vaccination Clinic  Name:  Erin Phelps    MRN: [id number] DOB: 12/10/1975  09/11/2019  Erin Phelps was observed post Covid-19 immunization for 15 minutes without incident. She was provided with Vaccine Information Sheet and instruction to access the V-Safe system.   Erin Phelps was instructed to call 911 with any severe reactions post vaccine: Marland Kitchen Difficulty breathing  . Swelling of face and throat  . A fast heartbeat  . A bad rash all over body  . Dizziness and weakness   Immunizations Administered    Name Date Dose VIS Date Route   Pfizer COVID-19 Vaccine 09/11/2019  9:56 AM 0.3 mL 05/15/2019 Intramuscular   Manufacturer: [employer], [employer]   Lot: [id number]   NDC: [address]-8

## 2019-10-06 ENCOUNTER — Ambulatory Visit: Payer: Self-pay | Attending: Internal Medicine

## 2019-10-06 DIAGNOSIS — Z23 Encounter for immunization: Secondary | ICD-10-CM

## 2019-10-06 NOTE — Progress Notes (Signed)
   Covid-19 Vaccination Clinic  Name:  Erin Phelps    MRN: [id number] DOB: 10/27/1975  10/06/2019  Erin Phelps was observed post Covid-19 immunization for 15 minutes without incident. She was provided with Vaccine Information Sheet and instruction to access the V-Safe system.   Erin Phelps was instructed to call 911 with any severe reactions post vaccine: Marland Kitchen Difficulty breathing  . Swelling of face and throat  . A fast heartbeat  . A bad rash all over body  . Dizziness and weakness   Immunizations Administered    Name Date Dose VIS Date Route   Pfizer COVID-19 Vaccine 10/06/2019  8:19 AM 0.3 mL 07/29/2018 Intramuscular   Manufacturer: [employer], [employer]   Lot: [id number]   NDC: [address]-5

## 2020-05-20 ENCOUNTER — Ambulatory Visit: Payer: Self-pay | Admitting: Internal Medicine

## 2020-05-20 DIAGNOSIS — Z0289 Encounter for other administrative examinations: Secondary | ICD-10-CM
